# Patient Record
Sex: Male | Born: 1960 | Race: White | Hispanic: No | Marital: Married | Smoking: Current every day smoker
Health system: Southern US, Community
[De-identification: ages and names within clinical notes are randomized; demographics above are authoritative.]

## PROBLEM LIST (undated history)

## (undated) DIAGNOSIS — L97329 Non-pressure chronic ulcer of left ankle with unspecified severity: Secondary | ICD-10-CM

## (undated) DIAGNOSIS — I219 Acute myocardial infarction, unspecified: Secondary | ICD-10-CM

## (undated) DIAGNOSIS — I82409 Acute embolism and thrombosis of unspecified deep veins of unspecified lower extremity: Secondary | ICD-10-CM

## (undated) HISTORY — DX: Non-pressure chronic ulcer of left ankle with unspecified severity: L97.329

## (undated) HISTORY — PX: ROTATOR CUFF REPAIR: SHX139

## (undated) HISTORY — DX: Acute embolism and thrombosis of unspecified deep veins of unspecified lower extremity: I82.409

## (undated) HISTORY — DX: Acute myocardial infarction, unspecified: I21.9

---

## 2001-12-06 ENCOUNTER — Ambulatory Visit (HOSPITAL_COMMUNITY): Admission: RE | Admit: 2001-12-06 | Discharge: 2001-12-06 | Payer: Self-pay | Admitting: Orthopedic Surgery

## 2001-12-06 ENCOUNTER — Encounter: Payer: Self-pay | Admitting: Orthopedic Surgery

## 2004-11-04 ENCOUNTER — Ambulatory Visit: Payer: Self-pay | Admitting: Family Medicine

## 2005-01-13 ENCOUNTER — Ambulatory Visit: Payer: Self-pay | Admitting: Family Medicine

## 2005-04-21 ENCOUNTER — Ambulatory Visit: Payer: Self-pay | Admitting: Family Medicine

## 2005-06-02 ENCOUNTER — Ambulatory Visit: Payer: Self-pay | Admitting: Family Medicine

## 2016-01-29 DIAGNOSIS — Z720 Tobacco use: Secondary | ICD-10-CM | POA: Insufficient documentation

## 2016-01-29 DIAGNOSIS — K219 Gastro-esophageal reflux disease without esophagitis: Secondary | ICD-10-CM

## 2016-01-29 DIAGNOSIS — J449 Chronic obstructive pulmonary disease, unspecified: Secondary | ICD-10-CM

## 2016-01-29 HISTORY — DX: Gastro-esophageal reflux disease without esophagitis: K21.9

## 2016-01-29 HISTORY — DX: Chronic obstructive pulmonary disease, unspecified: J44.9

## 2016-01-29 HISTORY — DX: Tobacco use: Z72.0

## 2016-02-26 DIAGNOSIS — I2510 Atherosclerotic heart disease of native coronary artery without angina pectoris: Secondary | ICD-10-CM | POA: Insufficient documentation

## 2018-06-02 DIAGNOSIS — Z7901 Long term (current) use of anticoagulants: Secondary | ICD-10-CM | POA: Diagnosis not present

## 2018-06-22 DIAGNOSIS — Z6827 Body mass index (BMI) 27.0-27.9, adult: Secondary | ICD-10-CM | POA: Diagnosis not present

## 2018-06-22 DIAGNOSIS — Z7901 Long term (current) use of anticoagulants: Secondary | ICD-10-CM | POA: Diagnosis not present

## 2018-06-22 DIAGNOSIS — F411 Generalized anxiety disorder: Secondary | ICD-10-CM | POA: Diagnosis not present

## 2018-06-22 DIAGNOSIS — Z79899 Other long term (current) drug therapy: Secondary | ICD-10-CM | POA: Diagnosis not present

## 2018-07-20 DIAGNOSIS — Z7901 Long term (current) use of anticoagulants: Secondary | ICD-10-CM | POA: Diagnosis not present

## 2018-07-20 DIAGNOSIS — Z87891 Personal history of nicotine dependence: Secondary | ICD-10-CM | POA: Diagnosis not present

## 2018-07-20 DIAGNOSIS — Z79899 Other long term (current) drug therapy: Secondary | ICD-10-CM | POA: Diagnosis not present

## 2018-07-20 DIAGNOSIS — Z2821 Immunization not carried out because of patient refusal: Secondary | ICD-10-CM | POA: Diagnosis not present

## 2018-07-20 DIAGNOSIS — F411 Generalized anxiety disorder: Secondary | ICD-10-CM | POA: Diagnosis not present

## 2018-07-20 DIAGNOSIS — Z1331 Encounter for screening for depression: Secondary | ICD-10-CM | POA: Diagnosis not present

## 2018-08-02 DIAGNOSIS — Z7901 Long term (current) use of anticoagulants: Secondary | ICD-10-CM | POA: Diagnosis not present

## 2018-08-11 DIAGNOSIS — Z7901 Long term (current) use of anticoagulants: Secondary | ICD-10-CM | POA: Diagnosis not present

## 2018-08-22 DIAGNOSIS — K219 Gastro-esophageal reflux disease without esophagitis: Secondary | ICD-10-CM | POA: Diagnosis not present

## 2018-08-22 DIAGNOSIS — Z7901 Long term (current) use of anticoagulants: Secondary | ICD-10-CM | POA: Diagnosis not present

## 2018-08-22 DIAGNOSIS — R739 Hyperglycemia, unspecified: Secondary | ICD-10-CM | POA: Diagnosis not present

## 2018-08-22 DIAGNOSIS — E785 Hyperlipidemia, unspecified: Secondary | ICD-10-CM | POA: Diagnosis not present

## 2018-08-22 DIAGNOSIS — I2510 Atherosclerotic heart disease of native coronary artery without angina pectoris: Secondary | ICD-10-CM | POA: Diagnosis not present

## 2018-09-05 DIAGNOSIS — Z7901 Long term (current) use of anticoagulants: Secondary | ICD-10-CM | POA: Diagnosis not present

## 2018-09-19 DIAGNOSIS — F411 Generalized anxiety disorder: Secondary | ICD-10-CM | POA: Diagnosis not present

## 2018-09-19 DIAGNOSIS — Z79899 Other long term (current) drug therapy: Secondary | ICD-10-CM | POA: Diagnosis not present

## 2018-09-19 DIAGNOSIS — R6882 Decreased libido: Secondary | ICD-10-CM | POA: Diagnosis not present

## 2018-09-19 DIAGNOSIS — Z7901 Long term (current) use of anticoagulants: Secondary | ICD-10-CM | POA: Diagnosis not present

## 2018-10-21 DIAGNOSIS — Z7901 Long term (current) use of anticoagulants: Secondary | ICD-10-CM | POA: Diagnosis not present

## 2018-10-27 DIAGNOSIS — E785 Hyperlipidemia, unspecified: Secondary | ICD-10-CM | POA: Diagnosis not present

## 2018-10-27 DIAGNOSIS — I2510 Atherosclerotic heart disease of native coronary artery without angina pectoris: Secondary | ICD-10-CM | POA: Diagnosis not present

## 2018-10-27 DIAGNOSIS — Z79899 Other long term (current) drug therapy: Secondary | ICD-10-CM | POA: Diagnosis not present

## 2018-10-27 DIAGNOSIS — F411 Generalized anxiety disorder: Secondary | ICD-10-CM | POA: Diagnosis not present

## 2018-10-30 DIAGNOSIS — I10 Essential (primary) hypertension: Secondary | ICD-10-CM | POA: Diagnosis not present

## 2018-10-30 DIAGNOSIS — R599 Enlarged lymph nodes, unspecified: Secondary | ICD-10-CM | POA: Diagnosis not present

## 2018-10-30 DIAGNOSIS — Z7901 Long term (current) use of anticoagulants: Secondary | ICD-10-CM | POA: Diagnosis not present

## 2018-10-30 DIAGNOSIS — Z6829 Body mass index (BMI) 29.0-29.9, adult: Secondary | ICD-10-CM | POA: Diagnosis not present

## 2018-10-30 DIAGNOSIS — Z86718 Personal history of other venous thrombosis and embolism: Secondary | ICD-10-CM | POA: Diagnosis not present

## 2018-10-30 DIAGNOSIS — R509 Fever, unspecified: Secondary | ICD-10-CM | POA: Diagnosis not present

## 2018-10-30 DIAGNOSIS — I2510 Atherosclerotic heart disease of native coronary artery without angina pectoris: Secondary | ICD-10-CM | POA: Diagnosis not present

## 2018-10-30 DIAGNOSIS — F17210 Nicotine dependence, cigarettes, uncomplicated: Secondary | ICD-10-CM | POA: Diagnosis not present

## 2018-10-30 DIAGNOSIS — K429 Umbilical hernia without obstruction or gangrene: Secondary | ICD-10-CM | POA: Diagnosis not present

## 2018-10-30 DIAGNOSIS — Z7982 Long term (current) use of aspirin: Secondary | ICD-10-CM | POA: Diagnosis not present

## 2018-10-30 DIAGNOSIS — R590 Localized enlarged lymph nodes: Secondary | ICD-10-CM | POA: Diagnosis not present

## 2018-10-30 DIAGNOSIS — R1032 Left lower quadrant pain: Secondary | ICD-10-CM | POA: Diagnosis not present

## 2018-10-30 DIAGNOSIS — I7090 Unspecified atherosclerosis: Secondary | ICD-10-CM | POA: Diagnosis not present

## 2018-10-30 DIAGNOSIS — J449 Chronic obstructive pulmonary disease, unspecified: Secondary | ICD-10-CM | POA: Diagnosis not present

## 2018-10-30 DIAGNOSIS — K5730 Diverticulosis of large intestine without perforation or abscess without bleeding: Secondary | ICD-10-CM | POA: Diagnosis not present

## 2018-10-30 DIAGNOSIS — Z7902 Long term (current) use of antithrombotics/antiplatelets: Secondary | ICD-10-CM | POA: Diagnosis not present

## 2018-11-01 DIAGNOSIS — I2510 Atherosclerotic heart disease of native coronary artery without angina pectoris: Secondary | ICD-10-CM | POA: Diagnosis not present

## 2018-11-01 DIAGNOSIS — Z7901 Long term (current) use of anticoagulants: Secondary | ICD-10-CM | POA: Diagnosis not present

## 2018-11-01 DIAGNOSIS — R590 Localized enlarged lymph nodes: Secondary | ICD-10-CM | POA: Diagnosis not present

## 2018-11-01 DIAGNOSIS — R7989 Other specified abnormal findings of blood chemistry: Secondary | ICD-10-CM | POA: Diagnosis not present

## 2018-11-10 DIAGNOSIS — Z79899 Other long term (current) drug therapy: Secondary | ICD-10-CM | POA: Diagnosis not present

## 2018-11-10 DIAGNOSIS — Z131 Encounter for screening for diabetes mellitus: Secondary | ICD-10-CM | POA: Diagnosis not present

## 2018-11-10 DIAGNOSIS — E785 Hyperlipidemia, unspecified: Secondary | ICD-10-CM | POA: Diagnosis not present

## 2018-11-10 DIAGNOSIS — I2510 Atherosclerotic heart disease of native coronary artery without angina pectoris: Secondary | ICD-10-CM | POA: Diagnosis not present

## 2018-11-10 DIAGNOSIS — Z6829 Body mass index (BMI) 29.0-29.9, adult: Secondary | ICD-10-CM | POA: Diagnosis not present

## 2018-11-10 DIAGNOSIS — Z7901 Long term (current) use of anticoagulants: Secondary | ICD-10-CM | POA: Diagnosis not present

## 2018-11-10 DIAGNOSIS — Z0000 Encounter for general adult medical examination without abnormal findings: Secondary | ICD-10-CM | POA: Diagnosis not present

## 2018-11-17 DIAGNOSIS — Z6829 Body mass index (BMI) 29.0-29.9, adult: Secondary | ICD-10-CM | POA: Diagnosis not present

## 2018-11-17 DIAGNOSIS — I872 Venous insufficiency (chronic) (peripheral): Secondary | ICD-10-CM | POA: Diagnosis not present

## 2018-11-17 DIAGNOSIS — Z79899 Other long term (current) drug therapy: Secondary | ICD-10-CM | POA: Diagnosis not present

## 2018-11-17 DIAGNOSIS — G629 Polyneuropathy, unspecified: Secondary | ICD-10-CM | POA: Diagnosis not present

## 2018-11-23 ENCOUNTER — Ambulatory Visit (HOSPITAL_COMMUNITY)
Admission: RE | Admit: 2018-11-23 | Discharge: 2018-11-23 | Disposition: A | Discharge status: Home / Self Care | Payer: BLUE CROSS/BLUE SHIELD | Source: Ambulatory Visit | Attending: Family | Admitting: Family

## 2018-11-23 ENCOUNTER — Ambulatory Visit: Payer: BLUE CROSS/BLUE SHIELD | Admitting: Vascular Surgery

## 2018-11-23 ENCOUNTER — Encounter: Payer: Self-pay | Admitting: Vascular Surgery

## 2018-11-23 ENCOUNTER — Other Ambulatory Visit: Payer: Self-pay

## 2018-11-23 ENCOUNTER — Other Ambulatory Visit: Payer: Self-pay | Admitting: *Deleted

## 2018-11-23 VITALS — BP 149/94 | HR 79 | Temp 97.1°F | Resp 18 | Ht 72.0 in | Wt 224.2 lb

## 2018-11-23 DIAGNOSIS — I872 Venous insufficiency (chronic) (peripheral): Secondary | ICD-10-CM

## 2018-11-23 DIAGNOSIS — L03116 Cellulitis of left lower limb: Secondary | ICD-10-CM

## 2018-11-23 DIAGNOSIS — I83812 Varicose veins of left lower extremities with pain: Secondary | ICD-10-CM

## 2018-11-23 MED ORDER — CEPHALEXIN 500 MG PO CAPS: 500.0000 mg | ORAL_CAPSULE | Freq: Three times a day (TID) | ORAL | 0 refills | Status: DC

## 2018-11-23 NOTE — Progress Notes (Signed)
Referring Physician: Dr Birdie Hopes  Patient name: John Jimenez MRN: [id number] DOB: [birth year] Sex: male  REASON FOR CONSULT: Multiple recurrent ulcerations left leg  HPI: John Jimenez is a 58 y.o. male who has had multiple exacerbation and remissions of a left ankle ulcer.  Currently this is healed.  He states that he develops ulcers frequently they take several weeks to heal up and then they return after they have healed.  He is currently followed at the wound clinic in [place].  He did have a trauma to the left leg when he was about 58 years old that crushed his left ankle area.  He is was seen by Dr. Jacolyn Reedy at [facility] in 2015.  At that point he was noted to have chronic left popliteal DVT as well as in the femoral vein mid thigh.  He was managed conservatively with compression stockings.  Continues to wear compression currently.  He has 30 to 40 mm knee-high stockings currently.  He currently is on warfarin and has been on this for 18 months.  Of note he developed swelling in his left groin with redness a few weeks ago.  He was given a course of Keflex that lasted 7 days.  The redness in his left groin has now returned.  He denies any fever or chills.  He does smoke about a pack of cigarettes per day.  I discussed with him for greater than 3 minutes today smoking cessation as far as its role in wound healing.  Past Medical History:  Diagnosis Date  . DVT (deep venous thrombosis) (HCC)   . Myocardial infarction (HCC)   . Ulcer of left ankle (HCC)    PSH; external fixation left leg  No family history on file.  SOCIAL HISTORY: Social History   Socioeconomic History  . Marital status: Married    Spouse name: Not on file  . Number of children: Not on file  . Years of education: Not on file  . Highest education level: Not on file  Occupational History  . Not on file  Social Needs  . Financial resource strain: Not on file  . Food insecurity:    Worry: Not on file   Inability: Not on file  . Transportation needs:    Medical: Not on file    Non-medical: Not on file  Tobacco Use  . Smoking status: Current Every Day Smoker    Packs/day: 1.00  . Smokeless tobacco: Never Used  Substance and Sexual Activity  . Alcohol use: Yes    Comment: occassional  . Drug use: Not on file  . Sexual activity: Not on file  Lifestyle  . Physical activity:    Days per week: Not on file    Minutes per session: Not on file  . Stress: Not on file  Relationships  . Social connections:    Talks on phone: Not on file    Gets together: Not on file    Attends religious service: Not on file    Active member of club or organization: Not on file    Attends meetings of clubs or organizations: Not on file    Relationship status: Not on file  . Intimate partner violence:    Fear of current or ex partner: Not on file    Emotionally abused: Not on file    Physically abused: Not on file    Forced sexual activity: Not on file  Other Topics Concern  . Not on file  Social History Narrative  . Not on file    No Known Allergies  Current Outpatient Medications  Medication Sig Dispense Refill  . ALPRAZolam (XANAX) 0.5 MG tablet TAKE 1 TABLET BY MOUTH 3 TIMES A DAY AS NEEDED    . aspirin 81 MG chewable tablet Chew by mouth.    . clopidogrel (PLAVIX) 75 MG tablet Take by mouth.    Marland Kitchen omeprazole (PRILOSEC) 40 MG capsule Take by mouth.    . simvastatin (ZOCOR) 40 MG tablet Take by mouth.    . warfarin (COUMADIN) 7.5 MG tablet Take by mouth. Takes 12.5 mg M/W/F and 10 mg on T/TH/Sat/Sun     No current facility-administered medications for this visit.     ROS:   General:  No weight loss, Fever, chills  HEENT: No recent headaches, no nasal bleeding, no visual changes, no sore throat  Neurologic: No dizziness, blackouts, seizures. No recent symptoms of stroke or mini- stroke. No recent episodes of slurred speech, or temporary blindness.  Cardiac: No recent episodes of chest  pain/pressure, no shortness of breath at rest.  No shortness of breath with exertion.  Denies history of atrial fibrillation or irregular heartbeat  Vascular: No history of rest pain in feet.  No history of claudication.  No history of non-healing ulcer, No history of DVT   Pulmonary: No home oxygen, no productive cough, no hemoptysis,  No asthma or wheezing  Musculoskeletal:  [ ]  Arthritis, [ ]  Low back pain,  [ ]  Joint pain  Hematologic:No history of hypercoagulable state.  No history of easy bleeding.  No history of anemia  Gastrointestinal: No hematochezia or melena,  No gastroesophageal reflux, no trouble swallowing  Urinary: [ ]  chronic Kidney disease, [ ]  on HD - [ ]  MWF or [ ]  TTHS, [ ]  Burning with urination, [ ]  Frequent urination, [ ]  Difficulty urinating;   Skin: No rashes  Psychological: No history of anxiety,  No history of depression   Physical Examination  Vitals:   11/23/18 1447  BP: (!) 149/94  Pulse: 79  Resp: 18  Temp: (!) 97.1 F (36.2 C)  TempSrc: Oral  SpO2: 99%  Weight: 224 lb 3.3 oz (101.7 kg)  Height: 6' (1.829 m)    Body mass index is 30.41 kg/m.  General:  Alert and oriented, no acute distress HEENT: Normal Neck: No JVD Cardiac: Regular Rate and Rhythm Skin: No rash, erythema and induration left anterior thigh extending over a segment is 7 cm x 3 cm with slightly tender to palpation no fluctuance  extremity Pulses:  2+ radial, brachial, femoral, dorsalis pedis, posterior tibial pulses bilaterally Musculoskeletal: No deformity trace left leg edema  Neurologic: Upper and lower extremity motor 5/5 and symmetric  DATA:  Patient had a venous reflux exam today which showed evidence of chronic DVT in the left popliteal vein.  There was also reflux in the greater and lesser saphenous vein.  Greater saphenous vein measured 7 to 10 mm.  There was also a large anterior branch extending over the left thigh.  Lesser saphenous was 5 to 6 mm.  There was  diffuse reflux in both.  ASSESSMENT: Symptomatic varicose veins with diffuse reflux in the superficial system in the left leg.  The patient has a fairly focal obstruction of his deep vein system in the popliteal vein on the left side.  2.  Erythema and induration left inguinal region consistent with inflamed lymph nodes.  I am unsure the etiology of this to but  I do not believe this is related to his veins.   PLAN: The patient was given a prescription today for long leg compression stockings to see if he gets symptomatic relief from this.  Will follow-up with [place] in 3 months time for consideration of laser ablation of his greater and lesser saphenous.  2.  The patient was placed on Keflex a 2-week course and has follow-up with his primary care physician next week.  Since this is his second course of antibiotics for this consideration should be given for biopsy of the lymph node if it does not resolve.  #3 as far as his DVT is concerned.  I suspect this is all chronic in nature.  He has now been on warfarin for 18 months.  This is sufficient treatment for an acute DVT.  I do not believe that he is necessarily at risk for recurrent DVT as he does not really have evidence of hypercoagulable state and has a traumatic event to explain why he had a DVT years ago.  Certainly if he developed a recurrent DVT lifelong anticoagulation might be warranted.  I will leave it at the discretion of his primary care physician whether or not he wishes to stop the warfarin.   Fabienne Bruns, MD [facility] Office: [phone] Pager: [phone]

## 2018-11-29 DIAGNOSIS — F411 Generalized anxiety disorder: Secondary | ICD-10-CM | POA: Diagnosis not present

## 2018-11-29 DIAGNOSIS — Z6829 Body mass index (BMI) 29.0-29.9, adult: Secondary | ICD-10-CM | POA: Diagnosis not present

## 2018-11-29 DIAGNOSIS — R591 Generalized enlarged lymph nodes: Secondary | ICD-10-CM | POA: Diagnosis not present

## 2018-11-29 DIAGNOSIS — Z79899 Other long term (current) drug therapy: Secondary | ICD-10-CM | POA: Diagnosis not present

## 2019-01-03 DIAGNOSIS — K219 Gastro-esophageal reflux disease without esophagitis: Secondary | ICD-10-CM | POA: Diagnosis not present

## 2019-01-03 DIAGNOSIS — F411 Generalized anxiety disorder: Secondary | ICD-10-CM | POA: Diagnosis not present

## 2019-01-03 DIAGNOSIS — I2510 Atherosclerotic heart disease of native coronary artery without angina pectoris: Secondary | ICD-10-CM | POA: Diagnosis not present

## 2019-01-03 DIAGNOSIS — Z7901 Long term (current) use of anticoagulants: Secondary | ICD-10-CM | POA: Diagnosis not present

## 2019-01-03 DIAGNOSIS — E785 Hyperlipidemia, unspecified: Secondary | ICD-10-CM | POA: Diagnosis not present

## 2019-03-01 ENCOUNTER — Ambulatory Visit: Payer: BLUE CROSS/BLUE SHIELD | Admitting: Vascular Surgery

## 2019-03-01 ENCOUNTER — Other Ambulatory Visit: Payer: Self-pay

## 2019-03-01 ENCOUNTER — Encounter: Payer: Self-pay | Admitting: Family

## 2019-03-09 ENCOUNTER — Encounter: Payer: Self-pay | Admitting: Family

## 2019-04-05 ENCOUNTER — Ambulatory Visit: Payer: BC Managed Care – PPO | Admitting: Vascular Surgery

## 2019-04-21 DIAGNOSIS — Z1211 Encounter for screening for malignant neoplasm of colon: Secondary | ICD-10-CM | POA: Diagnosis not present

## 2019-04-21 DIAGNOSIS — K621 Rectal polyp: Secondary | ICD-10-CM | POA: Diagnosis not present

## 2019-05-04 DIAGNOSIS — Z79899 Other long term (current) drug therapy: Secondary | ICD-10-CM | POA: Diagnosis not present

## 2019-05-04 DIAGNOSIS — E785 Hyperlipidemia, unspecified: Secondary | ICD-10-CM | POA: Diagnosis not present

## 2019-05-04 DIAGNOSIS — I2510 Atherosclerotic heart disease of native coronary artery without angina pectoris: Secondary | ICD-10-CM | POA: Diagnosis not present

## 2019-05-04 DIAGNOSIS — F411 Generalized anxiety disorder: Secondary | ICD-10-CM | POA: Diagnosis not present

## 2019-05-04 DIAGNOSIS — K219 Gastro-esophageal reflux disease without esophagitis: Secondary | ICD-10-CM | POA: Diagnosis not present

## 2019-10-11 DIAGNOSIS — Z2821 Immunization not carried out because of patient refusal: Secondary | ICD-10-CM | POA: Diagnosis not present

## 2019-10-11 DIAGNOSIS — F411 Generalized anxiety disorder: Secondary | ICD-10-CM | POA: Diagnosis not present

## 2019-10-11 DIAGNOSIS — Z87891 Personal history of nicotine dependence: Secondary | ICD-10-CM | POA: Diagnosis not present

## 2019-10-11 DIAGNOSIS — Z1331 Encounter for screening for depression: Secondary | ICD-10-CM | POA: Diagnosis not present

## 2019-11-15 DIAGNOSIS — I2510 Atherosclerotic heart disease of native coronary artery without angina pectoris: Secondary | ICD-10-CM | POA: Diagnosis not present

## 2019-11-15 DIAGNOSIS — Z79899 Other long term (current) drug therapy: Secondary | ICD-10-CM | POA: Diagnosis not present

## 2019-11-15 DIAGNOSIS — K219 Gastro-esophageal reflux disease without esophagitis: Secondary | ICD-10-CM | POA: Diagnosis not present

## 2019-11-15 DIAGNOSIS — Z131 Encounter for screening for diabetes mellitus: Secondary | ICD-10-CM | POA: Diagnosis not present

## 2019-11-15 DIAGNOSIS — E785 Hyperlipidemia, unspecified: Secondary | ICD-10-CM | POA: Diagnosis not present

## 2019-11-15 DIAGNOSIS — Z0000 Encounter for general adult medical examination without abnormal findings: Secondary | ICD-10-CM | POA: Diagnosis not present

## 2019-11-15 DIAGNOSIS — I10 Essential (primary) hypertension: Secondary | ICD-10-CM | POA: Diagnosis not present

## 2019-11-15 DIAGNOSIS — Z125 Encounter for screening for malignant neoplasm of prostate: Secondary | ICD-10-CM | POA: Diagnosis not present

## 2019-12-11 ENCOUNTER — Other Ambulatory Visit: Payer: Self-pay | Admitting: *Deleted

## 2019-12-11 DIAGNOSIS — I83812 Varicose veins of left lower extremities with pain: Secondary | ICD-10-CM

## 2019-12-18 DIAGNOSIS — I10 Essential (primary) hypertension: Secondary | ICD-10-CM | POA: Diagnosis not present

## 2019-12-18 DIAGNOSIS — Z79899 Other long term (current) drug therapy: Secondary | ICD-10-CM | POA: Diagnosis not present

## 2019-12-26 ENCOUNTER — Other Ambulatory Visit: Payer: Self-pay | Admitting: *Deleted

## 2019-12-26 DIAGNOSIS — I83812 Varicose veins of left lower extremities with pain: Secondary | ICD-10-CM

## 2019-12-26 DIAGNOSIS — I872 Venous insufficiency (chronic) (peripheral): Secondary | ICD-10-CM

## 2019-12-27 ENCOUNTER — Ambulatory Visit (HOSPITAL_COMMUNITY): Payer: BC Managed Care – PPO | Attending: Vascular Surgery

## 2019-12-27 ENCOUNTER — Ambulatory Visit: Payer: BC Managed Care – PPO | Admitting: Vascular Surgery

## 2020-01-26 NOTE — Progress Notes (Signed)
Cardiology Office Note:    Date:  01/29/2020   ID:  KELSEY EDMAN, DOB 17-Apr-1961, MRN [id number]  PCP:  Helen Hashimoto., MD  Cardiologist:  Shirlee More, MD   Referring MD: Helen Hashimoto., MD  ASSESSMENT:    1. Coronary artery disease of native artery of native heart with stable angina pectoris ([place])   2. Essential hypertension   3. Mixed hyperlipidemia    PLAN:    In order of problems listed above:  1. Known CAD previous apical myocardial infarction with nonobstructive CAD plan cardiac CTA continue medical therapy reassess in the office 6 weeks. 2. Stable continue current treatment beta-blocker ACE inhibitor 3. Lipids are ideal continue statin  Next appointment   Medication Adjustments/Labs and Tests Ordered: Current medicines are reviewed at length with the patient today.  Concerns regarding medicines are outlined above.  Orders Placed This Encounter  Procedures  . CT CORONARY MORPH W/CTA COR W/SCORE W/CA W/CM &/OR WO/CM  . CT CORONARY FRACTIONAL FLOW RESERVE DATA PREP  . CT CORONARY FRACTIONAL FLOW RESERVE FLUID ANALYSIS  . Basic Metabolic Panel (BMET)  . EKG 12-Lead   Meds ordered this encounter  Medications  . metoprolol tartrate (LOPRESSOR) 100 MG tablet    Sig: Take 1 tablet (100 mg total) by mouth once for 1 dose. Take two hours prior to your CT    Dispense:  1 tablet    Refill:  0     Chief Complaint  Patient presents with  . Follow-up  . Coronary Artery Disease    History of Present Illness:    John Jimenez is a 59 y.o. male who is being seen today for the evaluation of mild CAD hypertension dyslipidemia and COPD at the request of Helen Hashimoto., MD.  He had seen him more than 3 years ago 11/30/2016 at [facility] cardiology. He had left heart catheterization performed 05/03/2017with  the apex left ventricle was hypokinetic with 40% stenosis distal LAD 20% stenosis in the midsegment and no other areas of stenosis noted.  Ejection  fraction was estimated at 60%.  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  Recent labs from primary care physician 01/26/2020: Cholesterol 165 triglycerides 195 HDL 48 LDL 84 CBC normal hemoglobin 16.1 CMP normal except glucose 106 creatinine 0.78 potassium 4.5 normal liver function test.  We recognize each other.  Is been more than 3 years since last seen by me.  He is concerned about his cardiovascular prognosis and he now has exercise intolerance and fatigue but no discrete chest pain or shortness of breath and inquires about a repeat ischemia evaluation.  He no longer is anticoagulant he takes dual antiplatelet therapy and is pending intervention by vein specialist agrees with for further evaluation of CAD we discussed modalities cardiac CTA is appropriate and will be scheduled as outpatient.  Continue his current medical treatment including beta-blocker statin and dual antiplatelet therapy Past Medical History:  Diagnosis Date  . DVT (deep venous thrombosis) ([place])   . Myocardial infarction ([place])   . Ulcer of left ankle [facility])     Past Surgical History:  Procedure Laterality Date  . ROTATOR CUFF REPAIR      Current Medications: Current Meds  Medication Sig  . ALPRAZolam (XANAX) 0.25 MG tablet Take 0.5 mg by mouth 2 (two) times daily as needed.  . ASPIRIN LOW DOSE 81 MG EC tablet Take 81 mg by mouth daily.  Marland Kitchen  clopidogrel (PLAVIX) 75 MG tablet Take by mouth.  Marland Kitchen lisinopril (ZESTRIL) 20 MG tablet Take 20 mg by mouth daily.  Marland Kitchen omeprazole (PRILOSEC) 40 MG capsule Take by mouth.  . simvastatin (ZOCOR) 40 MG tablet Take by mouth.     Allergies:   Patient has no known allergies.   Social History   Socioeconomic History  . Marital status: Married    Spouse name: Not on file  . Number of children: Not on file  . Years of education: Not on file  . Highest education level: Not on file  Occupational History  . Not on  file  Tobacco Use  . Smoking status: Current Every Day Smoker    Packs/day: 1.00  . Smokeless tobacco: Never Used  Substance and Sexual Activity  . Alcohol use: Yes    Comment: occassional  . Drug use: Not on file  . Sexual activity: Not on file  Other Topics Concern  . Not on file  Social History Narrative  . Not on file   Social Determinants of Health   Financial Resource Strain:   . Difficulty of Paying Living Expenses:   Food Insecurity:   . Worried About Programme researcher, broadcasting/film/video in the Last Year:   . Barista in the Last Year:   Transportation Needs:   . Freight forwarder (Medical):   Marland Kitchen Lack of Transportation (Non-Medical):   Physical Activity:   . Days of Exercise per Week:   . Minutes of Exercise per Session:   Stress:   . Feeling of Stress :   Social Connections:   . Frequency of Communication with Friends and Family:   . Frequency of Social Gatherings with Friends and Family:   . Attends Religious Services:   . Active Member of Clubs or Organizations:   . Attends Banker Meetings:   Marland Kitchen Marital Status:      Family History: The patient's family history includes Breast cancer in his mother; Diabetes in his father; Heart attack in his father and paternal grandfather; Heart disease in his father; Hyperlipidemia in his father and mother; Hypertension in his father, maternal grandmother, and mother.  ROS:   ROS Please see the history of present illness.     All other systems reviewed and are negative.  EKGs/Labs/Other Studies Reviewed:    The following studies were reviewed today:   EKG:  EKG is  ordered today.  The ekg ordered today is personally reviewed and demonstrates sinus rhythm and is normal  Recent Labs: 11/15/2019 cholesterol 165 HDL 48 LDL 84 creatinine 0.78 A1c normal 5.7  Physical Exam:    VS:  BP (!) 142/102   Pulse 78   Temp 98.3 F (36.8 C)   Ht 6\' 1"  (1.854 m)   Wt 220 lb (99.8 kg)   SpO2 95%   BMI 29.03 kg/m      Wt Readings from Last 3 Encounters:  01/29/20 220 lb (99.8 kg)  11/23/18 224 lb 3.3 oz (101.7 kg)     GEN:  Well nourished, well developed in no acute distress HEENT: Normal NECK: No JVD; No carotid bruits LYMPHATICS: No lymphadenopathy CARDIAC: RRR, no murmurs, rubs, gallops RESPIRATORY:  Clear to auscultation without rales, wheezing or rhonchi  ABDOMEN: Soft, non-tender, non-distended MUSCULOSKELETAL:  No edema; No deformity  SKIN: Warm and dry NEUROLOGIC:  Alert and oriented x 3 PSYCHIATRIC:  Normal affect     Signed, 11/25/18, MD  01/29/2020 2:37 PM  [facility]

## 2020-01-29 ENCOUNTER — Encounter: Payer: Self-pay | Admitting: Cardiology

## 2020-01-29 ENCOUNTER — Ambulatory Visit: Payer: BC Managed Care – PPO | Admitting: Cardiology

## 2020-01-29 ENCOUNTER — Other Ambulatory Visit: Payer: Self-pay

## 2020-01-29 VITALS — BP 142/102 | HR 78 | Temp 98.3°F | Ht 73.0 in | Wt 220.0 lb

## 2020-01-29 DIAGNOSIS — E782 Mixed hyperlipidemia: Secondary | ICD-10-CM | POA: Diagnosis not present

## 2020-01-29 DIAGNOSIS — I25118 Atherosclerotic heart disease of native coronary artery with other forms of angina pectoris: Secondary | ICD-10-CM

## 2020-01-29 DIAGNOSIS — I10 Essential (primary) hypertension: Secondary | ICD-10-CM

## 2020-01-29 MED ORDER — METOPROLOL TARTRATE 100 MG PO TABS: 100.0000 mg | ORAL_TABLET | Freq: Once | ORAL | 0 refills | Status: DC

## 2020-01-29 NOTE — Patient Instructions (Signed)
Medication Instructions:  Your physician recommends that you continue on your current medications as directed. Please refer to the Current Medication list given to you today.  *If you need a refill on your cardiac medications before your next appointment, please call your pharmacy*   Lab Work: Your physician recommends that you return for lab work in: TODAY  BMP If you have labs (blood work) drawn today and your tests are completely normal, you will receive your results only by: . MyChart Message (if you have MyChart) OR . A paper copy in the mail If you have any lab test that is abnormal or we need to change your treatment, we will call you to review the results.   Testing/Procedures: Your cardiac CT will be scheduled at the below location:   [facility] [address] , [place] [address] [phone]   If scheduled at [facility], please arrive at the North Tower main entrance of [facility] 30 minutes prior to test start time. Proceed to the  Radiology Department (first floor) to check-in and test prep.   Please follow these instructions carefully (unless otherwise directed):  Hold all erectile dysfunction medications at least 3 days (72 hrs) prior to test.  On the Night Before the Test: . Be sure to Drink plenty of water. . Do not consume any caffeinated/decaffeinated beverages or chocolate 12 hours prior to your test. . Do not take any antihistamines 12 hours prior to your test.  On the Day of the Test: . Drink plenty of water. Do not drink any water within one hour of the test. . Do not eat any food 4 hours prior to the test. . You may take your regular medications prior to the test.  . Take metoprolol (Lopressor) two hours prior to test.       After the Test: . Drink plenty of water. . After receiving IV contrast, you may experience a mild flushed feeling. This is normal. . On occasion, you may experience a mild rash  up to 24 hours after the test. This is not dangerous. If this occurs, you can take Benadryl 25 mg and increase your fluid intake. . If you experience trouble breathing, this can be serious. If it is severe call 911 IMMEDIATELY. If it is mild, please call our office. . If you take any of these medications: Glipizide/Metformin, Avandament, Glucavance, please do not take 48 hours after completing test unless otherwise instructed.   Once we have confirmed authorization from your insurance company, we will call you to set up a date and time for your test.   For non-scheduling related questions, please contact the cardiac imaging nurse navigator should you have any questions/concerns: Sara Wallace, RN Navigator Cardiac Imaging  [facility] [phone] office  For scheduling needs, including cancellations and rescheduling, please call [phone].      Follow-Up: At [facility], you and your health needs are our priority.  As part of our continuing mission to provide you with exceptional heart care, we have created designated Provider Care Teams.  These Care Teams include your primary Cardiologist (physician) and Advanced Practice Providers (APPs -  Physician Assistants and Nurse Practitioners) who all work together to provide you with the care you need, when you need it.  We recommend signing up for the patient portal called "MyChart".  Sign up information is provided on this After Visit Summary.  MyChart is used to connect with patients for Virtual Visits (  Telemedicine).  Patients are able to view lab/test results, encounter notes, upcoming appointments, etc.  Non-urgent messages can be sent to your provider as well.   To learn more about what you can do with MyChart, go to [email or web address].    Your next appointment:   6 week(s)  The format for your next appointment:   In Person  Provider:   Brian Munley, MD   Other Instructions   

## 2020-01-30 ENCOUNTER — Telehealth: Payer: Self-pay

## 2020-01-30 LAB — BASIC METABOLIC PANEL
BUN/Creatinine Ratio: 11 (ref 9–20)
BUN: 8 mg/dL (ref 6–24)
CO2: 21 mmol/L (ref 20–29)
Calcium: 9.5 mg/dL (ref 8.7–10.2)
Chloride: 101 mmol/L (ref 96–106)
Creatinine, Ser: 0.76 mg/dL (ref 0.76–1.27)
GFR calc Af Amer: 116 mL/min/{1.73_m2} (ref >59)
GFR calc non Af Amer: 101 mL/min/{1.73_m2} (ref >59)
Glucose: 124 mg/dL — ABNORMAL HIGH (ref 65–99)
Potassium: 3.6 mmol/L (ref 3.5–5.2)
Sodium: 139 mmol/L (ref 134–144)

## 2020-01-30 NOTE — Telephone Encounter (Signed)
-----   Message from Brian J Munley, MD sent at 01/30/2020  8:28 AM EDT ----- Normal or stable result  No changes 

## 2020-01-30 NOTE — Telephone Encounter (Signed)
Tried calling patient. No answer and no voicemail set up for me to leave a message. 

## 2020-01-31 ENCOUNTER — Encounter: Payer: Self-pay | Admitting: *Deleted

## 2020-01-31 NOTE — Telephone Encounter (Signed)
Tried calling patient. No answer and no voicemail set up for me to leave a message. 

## 2020-02-05 ENCOUNTER — Other Ambulatory Visit: Payer: Self-pay | Admitting: *Deleted

## 2020-02-05 DIAGNOSIS — I83812 Varicose veins of left lower extremities with pain: Secondary | ICD-10-CM

## 2020-02-06 ENCOUNTER — Telehealth (HOSPITAL_COMMUNITY): Payer: Self-pay

## 2020-02-06 NOTE — Telephone Encounter (Signed)

## 2020-02-07 ENCOUNTER — Ambulatory Visit (HOSPITAL_COMMUNITY)
Admission: RE | Admit: 2020-02-07 | Discharge: 2020-02-07 | Disposition: A | Discharge status: Home / Self Care | Payer: BC Managed Care – PPO | Source: Ambulatory Visit | Attending: Vascular Surgery | Admitting: Vascular Surgery

## 2020-02-07 ENCOUNTER — Encounter: Payer: Self-pay | Admitting: Vascular Surgery

## 2020-02-07 ENCOUNTER — Ambulatory Visit (INDEPENDENT_AMBULATORY_CARE_PROVIDER_SITE_OTHER): Payer: BC Managed Care – PPO | Admitting: Vascular Surgery

## 2020-02-07 ENCOUNTER — Other Ambulatory Visit: Payer: Self-pay

## 2020-02-07 VITALS — BP 150/101 | HR 67 | Temp 97.9°F | Resp 16 | Ht 73.0 in | Wt 228.0 lb

## 2020-02-07 DIAGNOSIS — I83812 Varicose veins of left lower extremities with pain: Secondary | ICD-10-CM

## 2020-02-07 NOTE — Progress Notes (Signed)
Patient is a 59 year old male who returns for follow-up today.  He has had multiple exacerbation remissions of left ankle ulcer in the left calf ulcer.  He is currently still trying to get a left calf ulcer completely healed.  He had trauma to his left leg when he was 59 years old with an ankle crush fracture.  He has worn compression stockings since 2015.  He currently wears 30 to 40 mm of compression.  This controls the swelling but he still has episodes where he has skin breakdown.  He is now off anticoagulation.  Past Medical History:  Diagnosis Date  . DVT (deep venous thrombosis) (HCC)   . Myocardial infarction (HCC)   . Ulcer of left ankle [facility])     Past Surgical History:  Procedure Laterality Date  . ROTATOR CUFF REPAIR      Current Outpatient Medications on File Prior to Visit  Medication Sig Dispense Refill  . ALPRAZolam (XANAX) 0.25 MG tablet Take 0.5 mg by mouth 2 (two) times daily as needed.    . ASPIRIN LOW DOSE 81 MG EC tablet Take 81 mg by mouth daily.    . clopidogrel (PLAVIX) 75 MG tablet Take by mouth.    Marland Kitchen lisinopril (ZESTRIL) 20 MG tablet Take 20 mg by mouth daily.    Marland Kitchen omeprazole (PRILOSEC) 40 MG capsule Take by mouth.    . simvastatin (ZOCOR) 40 MG tablet Take by mouth.    . metoprolol tartrate (LOPRESSOR) 100 MG tablet Take 1 tablet (100 mg total) by mouth once for 1 dose. Take two hours prior to your CT 1 tablet 0   No current facility-administered medications on file prior to visit.      Review of systems: He has no shortness of breath.  He has no chest pain.  Physical exam:  Vitals:   02/07/20 1454  BP: (!) 150/101  Pulse: 67  Resp: 16  Temp: 97.9 F (36.6 C)  TempSrc: Temporal  SpO2: 99%  Weight: 228 lb (103.4 kg)  Height: 6\' 1"  (1.854 m)    Extremities: 2+ dorsalis pedis pulses  Skin: Hemosiderin staining left medial thigh and calf no open ulceration currently varicosities visible on the skin surface especially in the left medial thigh see  images below      Data: Patient had a venous duplex ultrasound today which showed reflux in the greater saphenous vein as well as within the deep system.  He did have chronic DVT in the popliteal vein but this was not occlusive.  Although vein diameter was noted to be 3 to 4 mm in the mid segment of the greater saphenous vein.  This is most likely because the deeper branch was measured but a more superficial branch measures 6 to 7 mm in diameter after it comes out of the fascia on my exam at the bedside with the SonoSite.  He also had dilation of the lesser saphenous vein to 5 mm with diffuse reflux.  On my exam with the SonoSite I was unable to completely tracked this into the popliteal vein and it appears to track to the superficial femoral vein higher in the thigh.  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  Patient also has diffuse superficial venous reflux with skin breakdown.  Plan: Laser ablation left greater saphenous vein with greater than 20 stab avulsions to decrease skin breakdown in his left leg.  His lesser saphenous vein is also dilated but this may be an important collateral  for his popliteal vein.  The popliteal vein was not completely occluded but I would not consider disrupting any of the lesser saphenous system unless he has continued symptoms after laser ablation of the left greater saphenous with stab avulsions.  We will hopefully get the patient scheduled in the near future pending insurance approval.  Ruta Hinds, MD [facility] Office: [phone]

## 2020-03-07 ENCOUNTER — Other Ambulatory Visit: Payer: Self-pay | Admitting: Cardiology

## 2020-03-11 NOTE — Progress Notes (Deleted)
Cardiology Office Note:    Date:  03/11/2020   ID:  Monia Sabal, DOB December 05, 1960, MRN [id number]  PCP:  Helen Hashimoto., MD  Cardiologist:  Shirlee More, MD    Referring MD: Helen Hashimoto., MD    ASSESSMENT:    No diagnosis found. PLAN:    In order of problems listed above:  1. ***   Next appointment: ***   Medication Adjustments/Labs and Tests Ordered: Current medicines are reviewed at length with the patient today.  Concerns regarding medicines are outlined above.  No orders of the defined types were placed in this encounter.  No orders of the defined types were placed in this encounter.   No chief complaint on file.   History of Present Illness:    John Jimenez is a 59 y.o. male with a hx of mild CAD hypertension dyslipidemia and COPD last seen 01/29/2020. 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 Patient also has diffuse superficial venous reflux with skin breakdown and follows with Dr. Oneida Alar vascular surgery [place]  Compliance with diet, lifestyle and medications: *** Past Medical History:  Diagnosis Date  . DVT (deep venous thrombosis) ([place])   . Myocardial infarction ([place])   . Ulcer of left ankle [facility])     Past Surgical History:  Procedure Laterality Date  . ROTATOR CUFF REPAIR      Current Medications: No outpatient medications have been marked as taking for the 03/12/20 encounter (Appointment) with Richardo Priest, MD.     Allergies:   Patient has no known allergies.   Social History   Socioeconomic History  . Marital status: Married    Spouse name: Not on file  . Number of children: Not on file  . Years of education: Not on file  . Highest education level: Not on file  Occupational History  . Not on file  Tobacco Use  . Smoking status: Current Every Day Smoker    Packs/day: 1.00  . Smokeless tobacco: Never Used  Substance and Sexual Activity  . Alcohol use: Yes    Comment:  occassional  . Drug use: Not on file  . Sexual activity: Not on file  Other Topics Concern  . Not on file  Social History Narrative  . Not on file   Social Determinants of Health   Financial Resource Strain:   . Difficulty of Paying Living Expenses:   Food Insecurity:   . Worried About Charity fundraiser in the Last Year:   . Arboriculturist in the Last Year:   Transportation Needs:   . Film/video editor (Medical):   Marland Kitchen Lack of Transportation (Non-Medical):   Physical Activity:   . Days of Exercise per Week:   . Minutes of Exercise per Session:   Stress:   . Feeling of Stress :   Social Connections:   . Frequency of Communication with Friends and Family:   . Frequency of Social Gatherings with Friends and Family:   . Attends Religious Services:   . Active Member of Clubs or Organizations:   . Attends Archivist Meetings:   Marland Kitchen Marital Status:      Family History: The patient's ***family history includes Breast cancer in his mother; Diabetes in his father; Heart attack in his father and paternal grandfather; Heart disease in his father; Hyperlipidemia in his father and mother; Hypertension in his father, maternal grandmother, and mother. ROS:   Please see the history  of present illness.    All other systems reviewed and are negative.  EKGs/Labs/Other Studies Reviewed:    The following studies were reviewed today:  EKG:  EKG ordered today and personally reviewed.  The ekg ordered today demonstrates ***  Recent Labs: 01/29/2020: BUN 8; Creatinine, Ser 0.76; Potassium 3.6; Sodium 139  Recent Lipid Panel No results found for: CHOL, TRIG, HDL, CHOLHDL, VLDL, LDLCALC, LDLDIRECT  Physical Exam:    VS:  There were no vitals taken for this visit.    Wt Readings from Last 3 Encounters:  02/07/20 228 lb (103.4 kg)  01/29/20 220 lb (99.8 kg)  11/23/18 224 lb 3.3 oz (101.7 kg)     GEN: *** Well nourished, well developed in no acute distress HEENT:  Normal NECK: No JVD; No carotid bruits LYMPHATICS: No lymphadenopathy CARDIAC: ***RRR, no murmurs, rubs, gallops RESPIRATORY:  Clear to auscultation without rales, wheezing or rhonchi  ABDOMEN: Soft, non-tender, non-distended MUSCULOSKELETAL:  No edema; No deformity  SKIN: Warm and dry NEUROLOGIC:  Alert and oriented x 3 PSYCHIATRIC:  Normal affect    Signed, Norman Herrlich, MD  03/11/2020 7:52 PM    [facility]

## 2020-03-12 ENCOUNTER — Telehealth: Payer: Self-pay

## 2020-03-12 ENCOUNTER — Ambulatory Visit: Payer: BC Managed Care – PPO | Admitting: Cardiology

## 2020-03-12 NOTE — Telephone Encounter (Signed)
error 

## 2020-03-19 ENCOUNTER — Other Ambulatory Visit: Payer: Self-pay | Admitting: *Deleted

## 2020-03-19 DIAGNOSIS — I83812 Varicose veins of left lower extremities with pain: Secondary | ICD-10-CM

## 2020-04-05 ENCOUNTER — Telehealth (HOSPITAL_COMMUNITY): Payer: Self-pay | Admitting: *Deleted

## 2020-04-05 NOTE — Telephone Encounter (Signed)
Attempted to call patient regarding upcoming cardiac CT appointment. Left message on message with name and callback number  Kerin Ransom Tai RN Navigator [facility] [phone] Office [phone] Cell

## 2020-04-08 ENCOUNTER — Encounter (HOSPITAL_COMMUNITY): Payer: Self-pay

## 2020-04-08 ENCOUNTER — Ambulatory Visit (HOSPITAL_COMMUNITY)
Admission: RE | Admit: 2020-04-08 | Discharge: 2020-04-08 | Disposition: A | Discharge status: Home / Self Care | Payer: BC Managed Care – PPO | Source: Ambulatory Visit | Attending: Cardiology | Admitting: Cardiology

## 2020-04-08 ENCOUNTER — Other Ambulatory Visit: Payer: Self-pay

## 2020-04-08 DIAGNOSIS — I25118 Atherosclerotic heart disease of native coronary artery with other forms of angina pectoris: Secondary | ICD-10-CM | POA: Insufficient documentation

## 2020-04-08 MED ORDER — NITROGLYCERIN 0.4 MG SL SUBL
SUBLINGUAL_TABLET | SUBLINGUAL | Status: AC
  Filled 2020-04-08: qty 2

## 2020-04-08 MED ORDER — NITROGLYCERIN 0.4 MG SL SUBL
0.8000 mg | SUBLINGUAL_TABLET | Freq: Once | SUBLINGUAL | Status: AC
  Administered 2020-04-08: 0.8 mg via SUBLINGUAL

## 2020-04-08 MED ORDER — IOHEXOL 350 MG/ML SOLN
80.0000 mL | Freq: Once | INTRAVENOUS | Status: AC | PRN
  Administered 2020-04-08: 80 mL via INTRAVENOUS

## 2020-04-11 ENCOUNTER — Ambulatory Visit (HOSPITAL_COMMUNITY)
Admission: RE | Admit: 2020-04-11 | Discharge: 2020-04-11 | Disposition: A | Discharge status: Home / Self Care | Payer: BC Managed Care – PPO | Source: Ambulatory Visit | Attending: Cardiology | Admitting: Cardiology

## 2020-04-11 DIAGNOSIS — I25118 Atherosclerotic heart disease of native coronary artery with other forms of angina pectoris: Secondary | ICD-10-CM | POA: Diagnosis not present

## 2020-04-12 ENCOUNTER — Telehealth: Payer: Self-pay

## 2020-04-12 NOTE — Telephone Encounter (Signed)
Spoke with patient regarding results and recommendation.  Patient verbalizes understanding and is agreeable to plan of care. Advised patient to call back with any issues or concerns.  

## 2020-04-12 NOTE — Telephone Encounter (Signed)
-----   Message from Baldo Daub, MD sent at 04/12/2020  4:11 PM EDT ----- The CTA is abnormal would like to see him in the office next week just not on afternoon when I am the only physician.

## 2020-04-13 NOTE — Progress Notes (Deleted)
Cardiology Office Note:    Date:  04/13/2020   ID:  John Jimenez, DOB [birth year], MRN [id number]  PCP:  Wilmer Floor., MD  Cardiologist:  Norman Herrlich, MD    Referring MD: Wilmer Floor., MD    ASSESSMENT:    No diagnosis found. PLAN:    In order of problems listed above:  1. ***   Next appointment: ***   Medication Adjustments/Labs and Tests Ordered: Current medicines are reviewed at length with the patient today.  Concerns regarding medicines are outlined above.  No orders of the defined types were placed in this encounter.  No orders of the defined types were placed in this encounter.   No chief complaint on file.   History of Present Illness:    John Jimenez is a 59 y.o. male with a hx of mild CAD hypertension dyslipidemia and COPD at the request of Wilmer Floor., MD.  He had seen him more than 3 years ago 11/30/2016 at [facility]. He had left heart catheterization performed 05/03/2017with  the apex left ventricle was hypokinetic with 40% stenosis distal LAD 20% stenosis in the midsegment and no other areas of stenosis noted.  Ejection fraction was estimated at 60%.  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 He was last seen 01/29/2020 and referred for cardiac CTA. Compliance with diet, lifestyle and medications: ***  Cardiac CTA showed a high calcium score 308 88th percentile for age and sex matched control and moderate CAD involving the mid left anterior descending coronary artery enlargement of the ascending aorta.  The LAD stenosis was 50 to 60% high risk markers of low attenuation core and napkin ring calcification.  Other stenoses were less than 50%.  Sending aorta measured 44 millimeters. Past Medical History:  Diagnosis Date  . DVT (deep venous thrombosis) (HCC)   . Myocardial infarction (HCC)   . Ulcer of left ankle [facility])     Past Surgical History:    Procedure Laterality Date  . ROTATOR CUFF REPAIR      Current Medications: No outpatient medications have been marked as taking for the 04/15/20 encounter (Appointment) with Baldo Daub, MD.     Allergies:   Patient has no known allergies.   Social History   Socioeconomic History  . Marital status: Married    Spouse name: Not on file  . Number of children: Not on file  . Years of education: Not on file  . Highest education level: Not on file  Occupational History  . Not on file  Tobacco Use  . Smoking status: Current Every Day Smoker    Packs/day: 1.00  . Smokeless tobacco: Never Used  Substance and Sexual Activity  . Alcohol use: Yes    Comment: occassional  . Drug use: Not on file  . Sexual activity: Not on file  Other Topics Concern  . Not on file  Social History Narrative  . Not on file   Social Determinants of Health   Financial Resource Strain:   . Difficulty of Paying Living Expenses:   Food Insecurity:   . Worried About Programme researcher, broadcasting/film/video in the Last Year:   . Barista in the Last Year:   Transportation Needs:   . Freight forwarder (Medical):   Marland Kitchen Lack of Transportation (Non-Medical):   Physical Activity:   . Days of Exercise per Week:   . Minutes of  Exercise per Session:   Stress:   . Feeling of Stress :   Social Connections:   . Frequency of Communication with Friends and Family:   . Frequency of Social Gatherings with Friends and Family:   . Attends Religious Services:   . Active Member of Clubs or Organizations:   . Attends Banker Meetings:   Marland Kitchen Marital Status:      Family History: The patient's ***family history includes Breast cancer in his mother; Diabetes in his father; Heart attack in his father and paternal grandfather; Heart disease in his father; Hyperlipidemia in his father and mother; Hypertension in his father, maternal grandmother, and mother. ROS:   Please see the history of present illness.    All  other systems reviewed and are negative.  EKGs/Labs/Other Studies Reviewed:    The following studies were reviewed today:  EKG:  EKG ordered today and personally reviewed.  The ekg ordered today demonstrates ***  Recent Labs: 01/29/2020: BUN 8; Creatinine, Ser 0.76; Potassium 3.6; Sodium 139  Recent Lipid Panel No results found for: CHOL, TRIG, HDL, CHOLHDL, VLDL, LDLCALC, LDLDIRECT  Physical Exam:    VS:  There were no vitals taken for this visit.    Wt Readings from Last 3 Encounters:  02/07/20 228 lb (103.4 kg)  01/29/20 220 lb (99.8 kg)  11/23/18 224 lb 3.3 oz (101.7 kg)     GEN: *** Well nourished, well developed in no acute distress HEENT: Normal NECK: No JVD; No carotid bruits LYMPHATICS: No lymphadenopathy CARDIAC: ***RRR, no murmurs, rubs, gallops RESPIRATORY:  Clear to auscultation without rales, wheezing or rhonchi  ABDOMEN: Soft, non-tender, non-distended MUSCULOSKELETAL:  No edema; No deformity  SKIN: Warm and dry NEUROLOGIC:  Alert and oriented x 3 PSYCHIATRIC:  Normal affect    Signed, Norman Herrlich, MD  04/13/2020 3:50 PM    [facility]

## 2020-04-14 ENCOUNTER — Ambulatory Visit (HOSPITAL_COMMUNITY)
Admission: RE | Admit: 2020-04-14 | Discharge: 2020-04-14 | Disposition: A | Discharge status: Home / Self Care | Payer: BC Managed Care – PPO | Source: Ambulatory Visit | Attending: Cardiology | Admitting: Cardiology

## 2020-04-14 ENCOUNTER — Other Ambulatory Visit: Payer: Self-pay | Admitting: Cardiology

## 2020-04-14 DIAGNOSIS — R079 Chest pain, unspecified: Secondary | ICD-10-CM

## 2020-04-15 ENCOUNTER — Ambulatory Visit: Payer: BC Managed Care – PPO | Admitting: Cardiology

## 2020-04-16 ENCOUNTER — Telehealth: Payer: Self-pay

## 2020-04-16 DIAGNOSIS — I25118 Atherosclerotic heart disease of native coronary artery with other forms of angina pectoris: Secondary | ICD-10-CM | POA: Diagnosis not present

## 2020-04-16 NOTE — Telephone Encounter (Signed)
-----   Message from Baldo Daub, MD sent at 04/16/2020 12:26 PM EDT ----- stable result  Please be sure he has an appointment for follow-up

## 2020-04-16 NOTE — Telephone Encounter (Signed)
Left message on patients voicemail to please return our call.   

## 2020-04-17 NOTE — Telephone Encounter (Signed)
Spoke with patient and patients wife and got him scheduled on 05/02/20. This was the best appointment that they felt like he could make because he is having a "surgical procedure" in several days. They verbalizes understanding of this appointment and did not have any other questions or concerns at this time.    Encouraged patient to call back with any questions or concerns.

## 2020-04-24 ENCOUNTER — Encounter: Payer: Self-pay | Admitting: Vascular Surgery

## 2020-04-24 ENCOUNTER — Other Ambulatory Visit: Payer: Self-pay

## 2020-04-24 ENCOUNTER — Ambulatory Visit (INDEPENDENT_AMBULATORY_CARE_PROVIDER_SITE_OTHER): Payer: BC Managed Care – PPO | Admitting: Vascular Surgery

## 2020-04-24 VITALS — BP 119/90 | HR 87 | Temp 97.0°F | Resp 18 | Ht 73.0 in | Wt 216.0 lb

## 2020-04-24 DIAGNOSIS — I83812 Varicose veins of left lower extremities with pain: Secondary | ICD-10-CM

## 2020-04-24 HISTORY — PX: ENDOVENOUS ABLATION SAPHENOUS VEIN W/ LASER: SUR449

## 2020-04-24 NOTE — Progress Notes (Signed)
° ° °   Laser Ablation Procedure    Date: 04/24/2020   John Jimenez DOB:28-Nov-1960  Consent signed: Yes    Surgeon: Fabienne Bruns MD  Procedure: Laser Ablation: left Greater Saphenous Vein  BP (!) 119/90 (BP Location: Left Arm, Patient Position: Sitting, Cuff Size: Normal)    Pulse 87    Temp (!) 97 F (36.1 C) (Temporal)    Resp 18    Ht 6\' 1"  (1.854 m)    Wt (!) 216 lb (98 kg)    SpO2 98%    BMI 28.50 kg/m   Tumescent Anesthesia: 600 cc 0.9% NaCl with 50 cc Lidocaine HCL 1%  and 15 cc 8.4% NaHCO3  Local Anesthesia: 12 cc Lidocaine HCL and NaHCO3 (ratio 2:1)  7 watts continuous mode     Total energy: 722 Joules    Total time: 103 seconds  Treatment Length  17 cm   Laser Fiber Ref. #     Lot # [id number]   Stab Phlebectomy: >20 Sites: Thigh and Calf  Patient tolerated procedure well  Notes: Patient wore face mask.  All staff members wore facial masks and facial shields/goggles.    Description of Procedure:  After marking the course of the secondary varicosities, the patient was placed on the operating table in the supine position, and the left leg was prepped and draped in sterile fashion.   Local anesthetic was administered and under ultrasound guidance the saphenous vein was accessed with a micro needle and guide wire; then the mirco puncture sheath was placed.  A guide wire was inserted saphenofemoral junction , followed by a 5 french sheath.  The position of the sheath and then the laser fiber below the junction was confirmed using the ultrasound.  Tumescent anesthesia was administered along the course of the saphenous vein using ultrasound guidance. The patient was placed in Trendelenburg position and protective laser glasses were placed on patient and staff, and the laser was fired at 7 watts continuous mode  for a total of 722 joules.   For stab phlebectomies, local anesthetic was administered at the previously marked varicosities, and tumescent anesthesia was  administered around the vessels.  Greater than 20 stab wounds were made using the tip of an 11 blade. And using the vein hook, the phlebectomies were performed using a hemostat to avulse the varicosities.  Adequate hemostasis was achieved.     Steri strips were applied to the stab wounds and ABD pads and thigh high compression stockings were applied.  Ace wrap bandages were applied over the phlebectomy sites and at the top of the saphenofemoral junction. Blood loss was less than 15 cc.  Discharge instructions reviewed with patient and hardcopy of discharge instructions given to patient to take home. The patient ambulated out of the operating room having tolerated the procedure well.  [id number], MD [facility] Office: [phone]

## 2020-05-01 ENCOUNTER — Ambulatory Visit (INDEPENDENT_AMBULATORY_CARE_PROVIDER_SITE_OTHER): Payer: BC Managed Care – PPO | Admitting: Vascular Surgery

## 2020-05-01 ENCOUNTER — Other Ambulatory Visit: Payer: Self-pay

## 2020-05-01 ENCOUNTER — Encounter: Payer: Self-pay | Admitting: Vascular Surgery

## 2020-05-01 ENCOUNTER — Ambulatory Visit (HOSPITAL_COMMUNITY)
Admission: RE | Admit: 2020-05-01 | Discharge: 2020-05-01 | Disposition: A | Discharge status: Home / Self Care | Payer: BC Managed Care – PPO | Source: Ambulatory Visit | Attending: Vascular Surgery | Admitting: Vascular Surgery

## 2020-05-01 VITALS — BP 159/109 | HR 70 | Temp 97.2°F | Resp 16 | Ht 73.0 in | Wt 216.0 lb

## 2020-05-01 DIAGNOSIS — I83812 Varicose veins of left lower extremities with pain: Secondary | ICD-10-CM

## 2020-05-01 DIAGNOSIS — I82412 Acute embolism and thrombosis of left femoral vein: Secondary | ICD-10-CM

## 2020-05-01 MED ORDER — APIXABAN 5 MG PO TABS: ORAL_TABLET | ORAL | 1 refills | Status: DC

## 2020-05-01 NOTE — Progress Notes (Signed)
   Patient is a 59 year old male who returns for follow-up today.  He underwent laser ablation of the left greater saphenous vein on April 24, 2020.  He still has some's mild swelling in his left leg minimal pain.  He has no shortness of breath or chest pain or hemoptysis.  Past Medical History:  Diagnosis Date  . DVT (deep venous thrombosis) ([facility])   . Myocardial infarction ([facility])   . Ulcer of left ankle [facility])      Current Outpatient Medications on File Prior to Visit  Medication Sig Dispense Refill  . ALPRAZolam (XANAX) 0.25 MG tablet Take 0.5 mg by mouth 2 (two) times daily as needed.    . ASPIRIN LOW DOSE 81 MG EC tablet Take 81 mg by mouth daily. (Patient not taking: Reported on 04/24/2020)    . clopidogrel (PLAVIX) 75 MG tablet Take by mouth. (Patient not taking: Reported on 04/24/2020)    . lisinopril (ZESTRIL) 20 MG tablet Take 20 mg by mouth daily.    . metoprolol tartrate (LOPRESSOR) 100 MG tablet TAKE 1 TABLET BY MOUTH ONCE FOR 1 DOSE, TAKE 2 HOURS PRIOR TO YOUR CT (Patient not taking: Reported on 04/24/2020) 1 tablet 0  . omeprazole (PRILOSEC) 40 MG capsule Take by mouth.    . simvastatin (ZOCOR) 40 MG tablet Take by mouth.     No current facility-administered medications on file prior to visit.    Physical exam:  Vitals:   05/01/20 1033  BP: (!) 159/109  Pulse: 70  Resp: 16  Temp: (!) 97.2 F (36.2 C)  TempSrc: Temporal  SpO2: 100%  Weight: 216 lb (98 kg)  Height: 6\' 1"  (1.854 m)    Left lower extremity mild edema approximately 5 to 10% larger than the right leg  Data: Patient had a duplex ultrasound today of the left greater saphenous vein which shows complete exclusion of this.  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  Assessment: Successful laser ablation left greater saphenous vein with EHIT 2/3  Plan: Findings were discussed with the patient today.  We will place him on Eliquis for 1 month and then repeat his  duplex ultrasound.  Since he is currently on aspirin and Plavix.  I have asked him to hold his Plavix until we complete his course of Eliquis so that he is not on all 3 medications at once.  , MD [facility] [place] Office: [phone]

## 2020-05-02 ENCOUNTER — Ambulatory Visit: Payer: BC Managed Care – PPO | Admitting: Cardiology

## 2020-05-02 ENCOUNTER — Encounter: Payer: Self-pay | Admitting: Cardiology

## 2020-05-02 ENCOUNTER — Other Ambulatory Visit: Payer: Self-pay | Admitting: *Deleted

## 2020-05-02 VITALS — BP 146/86 | HR 77 | Ht 73.0 in | Wt 222.0 lb

## 2020-05-02 DIAGNOSIS — I77810 Thoracic aortic ectasia: Secondary | ICD-10-CM | POA: Diagnosis not present

## 2020-05-02 DIAGNOSIS — I25118 Atherosclerotic heart disease of native coronary artery with other forms of angina pectoris: Secondary | ICD-10-CM

## 2020-05-02 DIAGNOSIS — E782 Mixed hyperlipidemia: Secondary | ICD-10-CM

## 2020-05-02 DIAGNOSIS — I10 Essential (primary) hypertension: Secondary | ICD-10-CM | POA: Diagnosis not present

## 2020-05-02 DIAGNOSIS — I83812 Varicose veins of left lower extremities with pain: Secondary | ICD-10-CM

## 2020-05-02 MED ORDER — ROSUVASTATIN CALCIUM 40 MG PO TABS: 40.0000 mg | ORAL_TABLET | Freq: Every day | ORAL | 3 refills | Status: DC

## 2020-05-02 MED ORDER — EZETIMIBE 10 MG PO TABS: 10.0000 mg | ORAL_TABLET | Freq: Every day | ORAL | 3 refills | Status: DC

## 2020-05-02 NOTE — Progress Notes (Signed)
Cardiology Office Note:    Date:  05/02/2020   ID:  [name], DOB [birth year], MRN [id number]  PCP:  Wilmer Floor., MD  Cardiologist:  Norman Herrlich, MD    Referring MD: Wilmer Floor., MD    ASSESSMENT:    1. Coronary artery disease of native artery of native heart with stable angina pectoris (HCC)   2. Essential hypertension   3. Mixed hyperlipidemia   4. Ascending aorta dilatation (HCC)    PLAN:    In order of problems listed above:  1. I reviewed his cardiac CTA with him he has a moderate stenosis normal FFR not having angina and I would continue his current medical therapy. 2. Continue current treatment 3. Intensify therapy high intensity statin and Zetia reassess in 6 weeks if not at target PCSK9 inhibitor 4. And need to follow-up CT 6 to 12 months   Next appointment: 3 months   Medication Adjustments/Labs and Tests Ordered: Current medicines are reviewed at length with the patient today.  Concerns regarding medicines are outlined above.  Orders Placed This Encounter  Procedures  . Lipid panel  . Lipoprotein A (LPA)   Meds ordered this encounter  Medications  . rosuvastatin (CRESTOR) 40 MG tablet    Sig: Take 1 tablet (40 mg total) by mouth daily.    Dispense:  90 tablet    Refill:  3  . ezetimibe (ZETIA) 10 MG tablet    Sig: Take 1 tablet (10 mg total) by mouth daily.    Dispense:  90 tablet    Refill:  3    No chief complaint on file.   History of Present Illness:    John Jimenez is a 59 y.o. male with a hx of mild CAD hypertension dyslipidemia and COPD at the request of Wilmer Floor., MD.  He had seen methan 3 years ago 11/30/2016 at [facility] cardiology. He had left heart catheterization performed 05/03/2017with  the apex left ventricle was hypokinetic with 40% stenosis distal LAD 20% stenosis in the midsegment and no other areas of stenosis noted.  Ejection fraction was estimated at 60%.  He has a history of post thrombotic  syndrome with chronic lower extremity edema and venous insufficiency class V. He was last seen 01/29/2020. Compliance with diet, lifestyle and medications: Yes  His cardiac CTA showed a high calcium score 308 and high percentile 88% for age and sex.  He had moderate stenosis in the mid LAD 50 to 69% with a high risk CT appearance and mildly to moderately dilated ascending aorta.  FFR was normal.  He also had enlargement of his ascending aorta and has hypertension which is now treated with ACE inhibitor and beta-blocker.  He has familial hyperlipidemia and intensified lipid-lowering therapy will get a high intensity statin Zetia check lipids in 6 weeks and if LDL is above 70 I would like to start him on PCSK9 therapy and his goal should really be an LDL less than 55.  He is very aware of his cardiovascular risk he is content on stopping smoking he has placed himself on a patch 14 mg and use gum or lozenge and he is down to half pack of cigarettes per day. Past Medical History:  Diagnosis Date  . DVT (deep venous thrombosis) (HCC)   . Myocardial infarction (HCC)   . Ulcer of left ankle [facility])     Past Surgical History:  Procedure Laterality Date  . ENDOVENOUS ABLATION SAPHENOUS VEIN W/ LASER  Left 04/24/2020   endovenous laser ablation left greater saphenous vein and stab phlebectomy >20 incisions left leg by Fabienne Bruns MD   . ROTATOR CUFF REPAIR      Current Medications: Current Meds  Medication Sig  . ALPRAZolam (XANAX) 0.25 MG tablet Take 0.5 mg by mouth 2 (two) times daily as needed.  Marland Kitchen apixaban (ELIQUIS) 5 MG TABS tablet Take 2 tablets (10mg ) twice daily for 7 days, then 1 tablet (5mg ) twice daily  . ASPIRIN LOW DOSE 81 MG EC tablet Take 81 mg by mouth daily.   . clopidogrel (PLAVIX) 75 MG tablet Take by mouth.   lisinopril (ZESTRIL) 20 MG tablet Take 20 mg by mouth daily.  . metoprolol tartrate (LOPRESSOR) 100 MG tablet TAKE 1 TABLET BY MOUTH ONCE FOR 1 DOSE, TAKE 2 HOURS PRIOR TO  YOUR CT  . omeprazole (PRILOSEC) 40 MG capsule Take by mouth.  . simvastatin (ZOCOR) 40 MG tablet Take by mouth.     Allergies:   Patient has no known allergies.   Social History   Socioeconomic History  . Marital status: Married    Spouse name: Not on file  . Number of children: Not on file  . Years of education: Not on file  . Highest education level: Not on file  Occupational History  . Not on file  Tobacco Use  . Smoking status: Current Every Day Smoker    Packs/day: 1.00  . Smokeless tobacco: Never Used  Substance and Sexual Activity  . Alcohol use: Yes    Comment: occassional  . Drug use: Not on file  . Sexual activity: Not on file  Other Topics Concern  . Not on file  Social History Narrative  . Not on file   Social Determinants of Health   Financial Resource Strain:   . Difficulty of Paying Living Expenses:   Food Insecurity:   . Worried About in the Last Year:   . Marland Kitchen in the Last Year:   Transportation Needs:   . Programme researcher, broadcasting/film/video (Medical):   Barista Lack of Transportation (Non-Medical):   Physical Activity:   . Days of Exercise per Week:   . Minutes of Exercise per Session:   Stress:   . Feeling of Stress :   Social Connections:   . Frequency of Communication with Friends and Family:   . Frequency of Social Gatherings with Friends and Family:   . Attends Religious Services:   . Active Member of Clubs or Organizations:   . Attends Freight forwarder Meetings:   Marland Kitchen Marital Status:      Family History: The patient's family history includes Breast cancer in his mother; Diabetes in his father; Heart attack in his father and paternal grandfather; Heart disease in his father; Hyperlipidemia in his father and mother; Hypertension in his father, maternal grandmother, and mother. ROS:   Please see the history of present illness.    All other systems reviewed and are negative.  EKGs/Labs/Other Studies Reviewed:    The  following studies were reviewed today:    Recent Labs: 01/29/2020: BUN 8; Creatinine, Ser 0.76; Potassium 3.6; Sodium 139  Recent Lipid Panel No results found for: CHOL, TRIG, HDL, CHOLHDL, VLDL, LDLCALC, LDLDIRECT  Physical Exam:    VS:  BP (!) 146/86   Pulse 77   Ht 6\' 1"  (1.854 m)   Wt 222 lb (100.7 kg)   SpO2 95%   BMI 29.29 kg/m  Wt Readings from Last 3 Encounters:  05/02/20 222 lb (100.7 kg)  05/01/20 216 lb (98 kg)  04/24/20 (!) 216 lb (98 kg)     GEN:  Well nourished, well developed in no acute distress HEENT: Normal NECK: No JVD; No carotid bruits LYMPHATICS: No lymphadenopathy CARDIAC: RRR, no murmurs, rubs, gallops RESPIRATORY:  Clear to auscultation without rales, wheezing or rhonchi  ABDOMEN: Soft, non-tender, non-distended MUSCULOSKELETAL:  No edema; No deformity  SKIN: Warm and dry NEUROLOGIC:  Alert and oriented x 3 PSYCHIATRIC:  Normal affect    Signed, Norman Herrlich, MD  05/02/2020 2:13 PM    [facility]

## 2020-05-02 NOTE — Patient Instructions (Addendum)
Medication Instructions:  Your physician has recommended you make the following change in your medication:  STOP: Simvastatin  START: Rosuvastatin 40 mg take one tablet by mouth daily.  START: Zetia 10 mg take one tablet by mouth daily.  *If you need a refill on your cardiac medications before your next appointment, please call your pharmacy*   Lab Work: Your physician recommends that you return for lab work in: 6 weeks Lipids, LPA If you have labs (blood work) drawn today and your tests are completely normal, you will receive your results only by: Marland Kitchen MyChart Message (if you have MyChart) OR . A paper copy in the mail If you have any lab test that is abnormal or we need to change your treatment, we will call you to review the results.   Testing/Procedures: None   Follow-Up: At [facility], you and your health needs are our priority.  As part of our continuing mission to provide you with exceptional heart care, we have created designated Provider Care Teams.  These Care Teams include your primary Cardiologist (physician) and Advanced Practice Providers (APPs -  Physician Assistants and Nurse Practitioners) who all work together to provide you with the care you need, when you need it.  We recommend signing up for the patient portal called "MyChart".  Sign up information is provided on this After Visit Summary.  MyChart is used to connect with patients for Virtual Visits (Telemedicine).  Patients are able to view lab/test results, encounter notes, upcoming appointments, etc.  Non-urgent messages can be sent to your provider as well.   To learn more about what you can do with MyChart, go to [email or web address].    Your next appointment:   3 month(s)  The format for your next appointment:   In Person  Provider:   Norman Herrlich, MD   Other Instructions Use over the counter 14 mg patch and nicotine gum.

## 2020-06-06 ENCOUNTER — Ambulatory Visit (HOSPITAL_COMMUNITY)
Admission: RE | Admit: 2020-06-06 | Discharge: 2020-06-06 | Disposition: A | Discharge status: Home / Self Care | Payer: BC Managed Care – PPO | Source: Ambulatory Visit | Attending: Vascular Surgery | Admitting: Vascular Surgery

## 2020-06-06 ENCOUNTER — Encounter: Payer: Self-pay | Admitting: Vascular Surgery

## 2020-06-06 ENCOUNTER — Ambulatory Visit (INDEPENDENT_AMBULATORY_CARE_PROVIDER_SITE_OTHER): Payer: BC Managed Care – PPO | Admitting: Vascular Surgery

## 2020-06-06 ENCOUNTER — Other Ambulatory Visit: Payer: Self-pay

## 2020-06-06 VITALS — BP 144/95 | HR 68 | Temp 97.9°F | Resp 20 | Ht 73.0 in | Wt 218.0 lb

## 2020-06-06 DIAGNOSIS — I83812 Varicose veins of left lower extremities with pain: Secondary | ICD-10-CM | POA: Diagnosis not present

## 2020-06-06 NOTE — Progress Notes (Signed)
Patient is a 59 year old male returns for follow-up today.  He underwent laser ablation of his left greater saphenous vein April 24, 2020.  This was complicated by some partial thrombus in the common femoral vein he was placed on Eliquis for 1 month.  He reports he still has some swelling in his left leg but it is overall improved from preprocedure.  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  Physical exam:  Vitals:   06/06/20 1438  BP: (!) 144/95  Pulse: 68  Resp: 20  Temp: 97.9 F (36.6 C)  SpO2: 98%  Weight: 218 lb (98.9 kg)  Height: 6\' 1"  (1.854 m)    Chronic hemosiderin staining left gaiter area.  Edema left leg approximately 15% larger than the right leg but slightly improved from preoperatively.  Data: Patient had a repeat duplex ultrasound today which shows the thrombus that was extending into his common femoral vein has resolved.  There is partial thrombosis of the left greater saphenous vein with it being more dense in the mid segment.  Assessment: Doing well status post laser ablation left greater saphenous vein.  I encouraged the patient to continue to wear his thigh-high compression stockings to continue to form scar tissue in the left greater saphenous vein to keep this out of the circuit.  We also discussed that he will probably continue to have chronic swelling due to his popliteal DVT previously.  No further intervention planned at this point.  He seems satisfied that his swelling has improved somewhat with the laser ablation and stab avulsions.  Plan: Patient will continue wear his thigh-high compression stockings.  He will follow up on an as-needed basis.  , MD [facility] Office: [phone]

## 2020-06-19 ENCOUNTER — Ambulatory Visit: Payer: BC Managed Care – PPO | Admitting: Vascular Surgery

## 2020-06-19 ENCOUNTER — Encounter (HOSPITAL_COMMUNITY): Payer: BC Managed Care – PPO

## 2020-08-05 DIAGNOSIS — I219 Acute myocardial infarction, unspecified: Secondary | ICD-10-CM | POA: Insufficient documentation

## 2020-08-05 DIAGNOSIS — I82409 Acute embolism and thrombosis of unspecified deep veins of unspecified lower extremity: Secondary | ICD-10-CM | POA: Insufficient documentation

## 2020-08-05 DIAGNOSIS — L97329 Non-pressure chronic ulcer of left ankle with unspecified severity: Secondary | ICD-10-CM | POA: Insufficient documentation

## 2020-08-08 ENCOUNTER — Ambulatory Visit: Payer: BC Managed Care – PPO | Admitting: Cardiology

## 2020-08-08 NOTE — Progress Notes (Deleted)
Cardiology Office Note:    Date:  08/08/2020   ID:  [name], DOB May 20, 1961, MRN [id number]  PCP:  Wilmer Floor., MD  Cardiologist:  Norman Herrlich, MD    Referring MD: Wilmer Floor., MD    ASSESSMENT:    No diagnosis found. PLAN:    In order of problems listed above:  1. ***   Next appointment: ***   Medication Adjustments/Labs and Tests Ordered: Current medicines are reviewed at length with the patient today.  Concerns regarding medicines are outlined above.  No orders of the defined types were placed in this encounter.  No orders of the defined types were placed in this encounter.   No chief complaint on file.   History of Present Illness:    John Jimenez is a 59 y.o. male with a hx of CAD hypertension dyslipidemia and COPD he was 05/02/2020 last seen .  Left heart catheterization May 2017 showed apical hypokinesia and mild nonobstructive CAD ejection fraction estimated 60%.  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  Cardiac CTA 04/08/2020 shows a high calcium score 308, 88th percentile with moderate stenosis mid LAD 50 to 69% and mild to moderate enlargement of the ascending aorta. Compliance with diet, lifestyle and medications: *** Past Medical History:  Diagnosis Date  . COPD (chronic obstructive pulmonary disease) (HCC) 01/29/2016  . DVT (deep venous thrombosis) (HCC)   . GERD (gastroesophageal reflux disease) 01/29/2016  . Myocardial infarction (HCC)   . Tobacco abuse 01/29/2016  . Ulcer of left ankle [facility])     Past Surgical History:  Procedure Laterality Date  . ENDOVENOUS ABLATION SAPHENOUS VEIN W/ LASER Left 04/24/2020   endovenous laser ablation left greater saphenous vein and stab phlebectomy >20 incisions left leg by Fabienne Bruns MD   . ROTATOR CUFF REPAIR      Current Medications: No outpatient medications have been marked as taking for the 08/08/20 encounter  (Appointment) with Baldo Daub, MD.     Allergies:   Patient has no known allergies.   Social History   Socioeconomic History  . Marital status: Married    Spouse name: Not on file  . Number of children: Not on file  . Years of education: Not on file  . Highest education level: Not on file  Occupational History  . Not on file  Tobacco Use  . Smoking status: Current Every Day Smoker    Packs/day: 1.00  . Smokeless tobacco: Never Used  Vaping Use  . Vaping Use: Never used  Substance and Sexual Activity  . Alcohol use: Yes    Comment: occassional  . Drug use: Not on file  . Sexual activity: Not on file  Other Topics Concern  . Not on file  Social History Narrative  . Not on file   Social Determinants of Health   Financial Resource Strain:   . Difficulty of Paying Living Expenses: Not on file  Food Insecurity:   . Worried About Programme researcher, broadcasting/film/video in the Last Year: Not on file  . Ran Out of Food in the Last Year: Not on file  Transportation Needs:   . Lack of Transportation (Medical): Not on file  . Lack of Transportation (Non-Medical): Not on file  Physical Activity:   . Days of Exercise per Week: Not on file  . Minutes of Exercise per Session: Not on file  Stress:   . Feeling of  Stress : Not on file  Social Connections:   . Frequency of Communication with Friends and Family: Not on file  . Frequency of Social Gatherings with Friends and Family: Not on file  . Attends Religious Services: Not on file  . Active Member of Clubs or Organizations: Not on file  . Attends Banker Meetings: Not on file  . Marital Status: Not on file     Family History: The patient's ***family history includes Breast cancer in his mother; Diabetes in his father; Heart attack in his father and paternal grandfather; Heart disease in his father; Hyperlipidemia in his father and mother; Hypertension in his father, maternal grandmother, and mother. ROS:   Please see the  history of present illness.    All other systems reviewed and are negative.  EKGs/Labs/Other Studies Reviewed:    The following studies were reviewed today:  EKG:  EKG ordered today and personally reviewed.  The ekg ordered today demonstrates ***  Recent Labs: 01/29/2020: BUN 8; Creatinine, Ser 0.76; Potassium 3.6; Sodium 139  Recent Lipid Panel No results found for: CHOL, TRIG, HDL, CHOLHDL, VLDL, LDLCALC, LDLDIRECT  Physical Exam:    VS:  There were no vitals taken for this visit.    Wt Readings from Last 3 Encounters:  06/06/20 218 lb (98.9 kg)  05/02/20 222 lb (100.7 kg)  05/01/20 216 lb (98 kg)     GEN: *** Well nourished, well developed in no acute distress HEENT: Normal NECK: No JVD; No carotid bruits LYMPHATICS: No lymphadenopathy CARDIAC: ***RRR, no murmurs, rubs, gallops RESPIRATORY:  Clear to auscultation without rales, wheezing or rhonchi  ABDOMEN: Soft, non-tender, non-distended MUSCULOSKELETAL:  No edema; No deformity  SKIN: Warm and dry NEUROLOGIC:  Alert and oriented x 3 PSYCHIATRIC:  Normal affect    Signed, Norman Herrlich, MD  08/08/2020 7:45 AM    [facility]

## 2020-09-26 IMAGING — CT CT HEART MORP W/ CTA COR W/ SCORE W/ CA W/CM &/OR W/O CM
4 of 7 series · 8 of 20 positions shown, 9 images · IV contrast (APPLIED)
Comparison: None.
COMPARISON: None.

Addendum:
EXAM:
OVER-READ INTERPRETATION  CT CHEST

The following report is an over-read performed by radiologist Dr.
Yoel Tiger [REDACTED] on 04/08/2020. This over-read
does not include interpretation of cardiac or coronary anatomy or
pathology. The coronary CTA interpretation by the cardiologist is
attached.
TECHNIQUE: The patient was scanned on a Phillips Force scanner.

[Series 6: best diast 74 % · axial · 0.39mm/px · z∈[+226,+274]mm · 2 of 361 slices shown, 3 images]
[im 121/361  vessel]
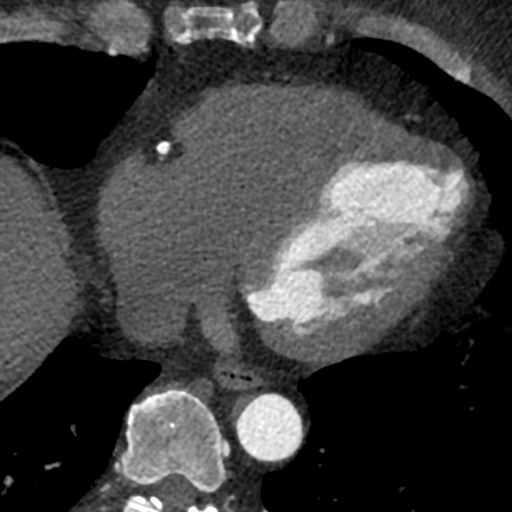
[im 121/361  lung]
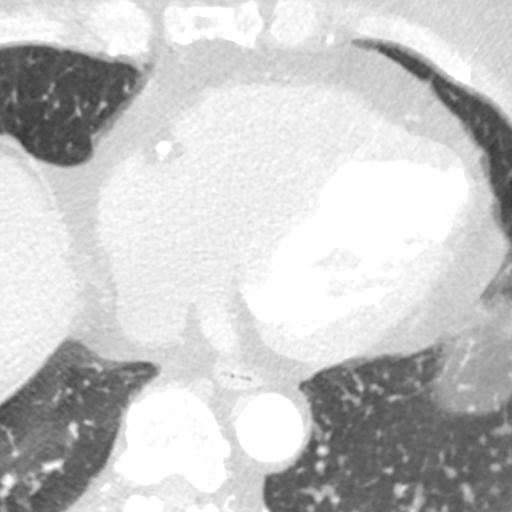
[im 241/361  vessel]
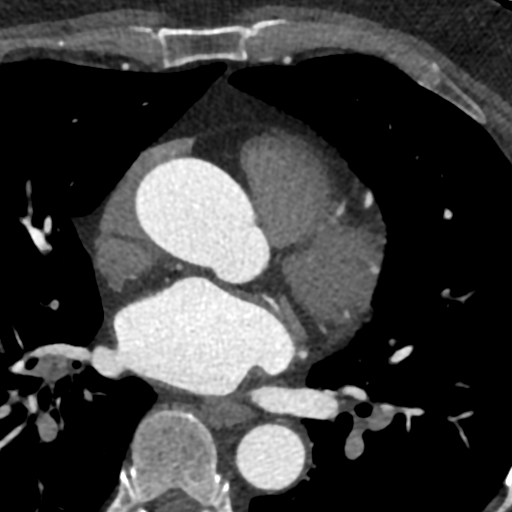

[Series 7: best syst 33 % · axial · 0.39mm/px · z∈[+226,+274]mm · 2 of 361 slices shown]
[im 121/361  vessel]
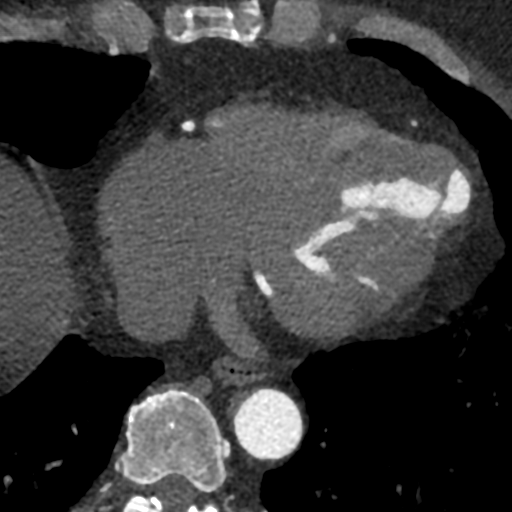
[im 241/361  vessel]
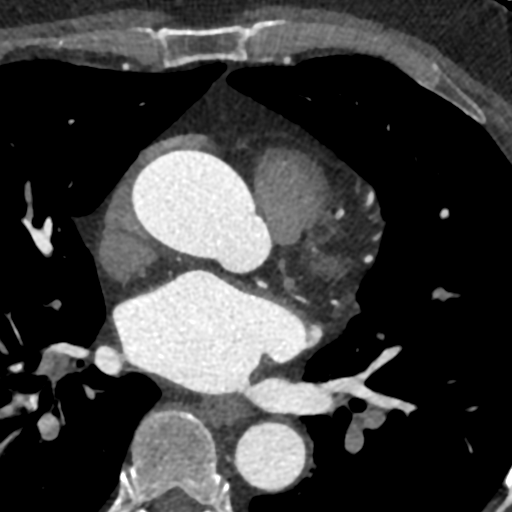

[Series 8: ts diast sharp 74 % · axial · 0.39mm/px · z∈[+226,+274]mm · 2 of 361 slices shown]
[im 121/361  lung]
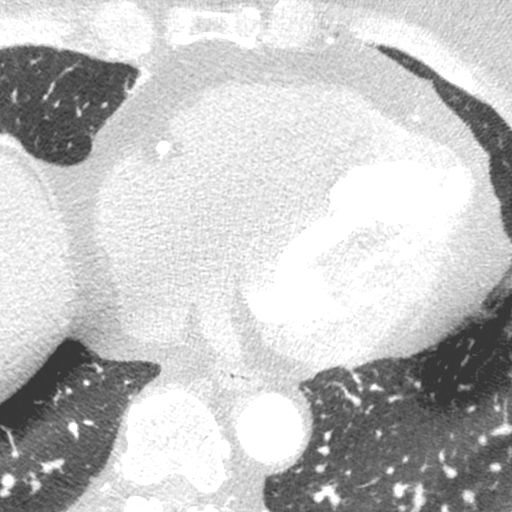
[im 241/361  lung]
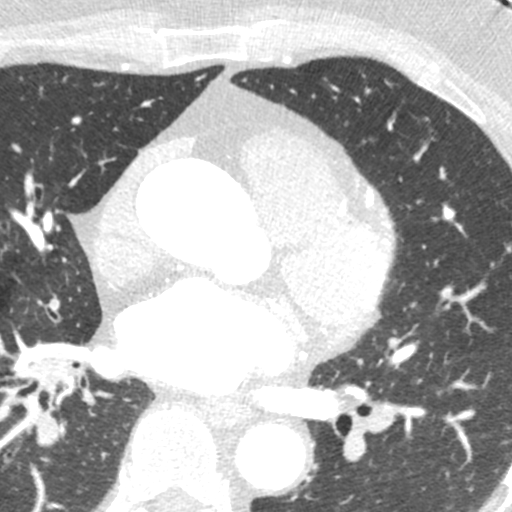

[Series 9: ts syst sharp 33 % · axial · 0.39mm/px · z∈[+226,+274]mm · 2 of 361 slices shown]
[im 121/361  lung]
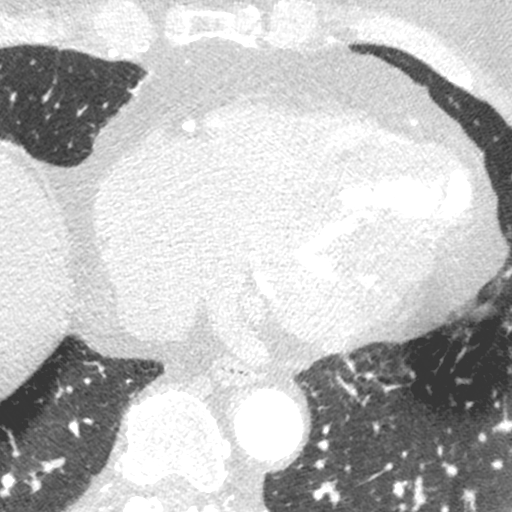
[im 241/361  lung]
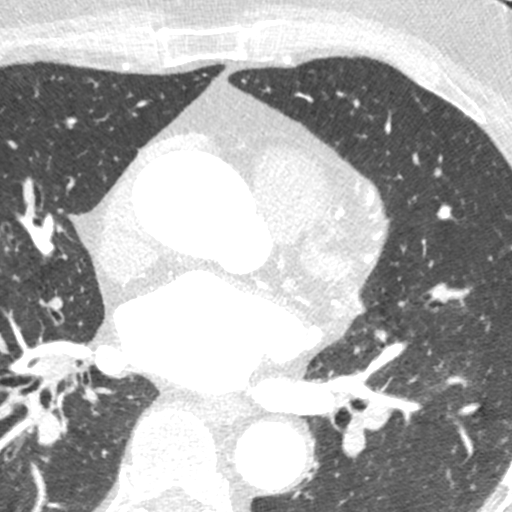

[8 of 20 positions shown; findings below may reference images not displayed]

FINDINGS: Vascular: Aneurysmal dilatation of the ascending thoracic aorta,
cm maximally. Heart is borderline in size.

Mediastinum/Nodes: No adenopathy.

Lungs/Pleura: Visualized lungs clear.  No effusions.

Upper Abdomen: Imaging into the upper abdomen shows no acute
findings.

Musculoskeletal: Chest wall soft tissues are unremarkable. No acute
bony abnormality.
IMPRESSION: 4.4 cm ascending thoracic aortic aneurysm. Recommend annual imaging
followup by CTA or MRA. This recommendation follows 8131
ACCF/AHA/AATS/ACR/ASA/SCA/JIM/DELORES/DUBLJANIC/ABDULBARI Guidelines for the
Diagnosis and Management of Patients with Thoracic Aortic Disease.
Circulation. 8131; 121: E266-e369. Aortic aneurysm NOS (BFNM0-GDO.G)

EXAM:
Cardiac/Coronary  CT
FINDINGS: A 120 kV prospective scan was triggered in the descending thoracic
aorta at 111 HU's. Axial non-contrast 3 mm slices were carried out
through the heart. The data set was analyzed on a dedicated work
station and scored using the Agatson method. Gantry rotation speed
was 250 msecs and collimation was .6 mm. No beta blockade and 0.8 mg
of sl NTG was given. The 3D data set was reconstructed in 5%
intervals of the 67-82 % of the R-R cycle. Diastolic phases were
analyzed on a dedicated work station using MPR, MIP and VRT modes.
The patient received 80 cc of contrast.

Aorta: Mild to moderately dilated ascending aorta at 44mm with no
calcifications. No dissection.

Aortic Valve:  Trileaflet.  Minimal calcifications.

Coronary Arteries:  Normal coronary origin.  Right dominance.

RCA is a large dominant artery that gives rise to PDA and PLVB.
There is minimal calcified plaque in the proximal RCA with
associated stenosis of 0-24% followed by mild non-calcified plaque
just distal to calcified lesion with associated stenosis of 25-49%.

Left main is a large artery that gives rise to LAD, Ramus and LCX
arteries. There is no plaque.

LAD is a large vessel that gives rise to a large branching D1 and
small D2 vessels. There is minimal calcified plaque in the ostial
LAD with associated stenosis of 0-24%. There is moderate mixed
plaque in the mid LAD at the takeoff of a large branching diagonal 1
with associated stenosis of 50-69%. This plaque has a low
attenuation core < 40HU with napkin ring spotty calcification and is
considered a high risk lesion. There is minimal calcified plaque in
the mid LAD with associated stenosis of 0-24%.

The Ramus is a moderate sized vessel with no plaque.

LCX is a non-dominant artery that gives rise to a moderate sized OM1
branch. There is no plaque.

Other findings:

Normal pulmonary vein drainage into the left atrium.

Normal let atrial appendage without a thrombus.

Normal size of the pulmonary artery.
IMPRESSION: 1. Coronary calcium score of 308. This was 88th percentile for age
and sex matched control.

2.  Normal coronary origin with right dominance.

3. Moderate atherosclerosis of the mid LAD with high risk lesion.
CAD-RADS 3.

4. Mildly to moderately dilated ascending aorta. Recommend yearly
followup.

4. Consider symptom-guided anti-ischemic and preventive
pharmacotherapy as well as risk factor modification per
guideline-directed care.

5.  This study has been submitted for FFR analysis.

Marifer Dhakal

*** End of Addendum ***
EXAM:
OVER-READ INTERPRETATION  CT CHEST

The following report is an over-read performed by radiologist Dr.
Yoel Tiger [REDACTED] on 04/08/2020. This over-read
does not include interpretation of cardiac or coronary anatomy or
pathology. The coronary CTA interpretation by the cardiologist is
attached.
FINDINGS: Vascular: Aneurysmal dilatation of the ascending thoracic aorta,
cm maximally. Heart is borderline in size.

Mediastinum/Nodes: No adenopathy.

Lungs/Pleura: Visualized lungs clear.  No effusions.

Upper Abdomen: Imaging into the upper abdomen shows no acute
findings.

Musculoskeletal: Chest wall soft tissues are unremarkable. No acute
bony abnormality.
IMPRESSION: 4.4 cm ascending thoracic aortic aneurysm. Recommend annual imaging
followup by CTA or MRA. This recommendation follows 8131
ACCF/AHA/AATS/ACR/ASA/SCA/JIM/DELORES/DUBLJANIC/ABDULBARI Guidelines for the
Diagnosis and Management of Patients with Thoracic Aortic Disease.
Circulation. 8131; 121: [id number]. Aortic aneurysm NOS (BFNM0-GDO.G)

## 2020-10-09 DIAGNOSIS — F411 Generalized anxiety disorder: Secondary | ICD-10-CM | POA: Diagnosis not present

## 2020-10-09 DIAGNOSIS — Z6829 Body mass index (BMI) 29.0-29.9, adult: Secondary | ICD-10-CM | POA: Diagnosis not present

## 2020-10-09 DIAGNOSIS — Z87891 Personal history of nicotine dependence: Secondary | ICD-10-CM | POA: Diagnosis not present

## 2020-10-28 ENCOUNTER — Other Ambulatory Visit: Payer: Self-pay

## 2020-12-31 DIAGNOSIS — Z6829 Body mass index (BMI) 29.0-29.9, adult: Secondary | ICD-10-CM | POA: Diagnosis not present

## 2020-12-31 DIAGNOSIS — Z79899 Other long term (current) drug therapy: Secondary | ICD-10-CM | POA: Diagnosis not present

## 2020-12-31 DIAGNOSIS — Z87891 Personal history of nicotine dependence: Secondary | ICD-10-CM | POA: Diagnosis not present

## 2020-12-31 DIAGNOSIS — Z131 Encounter for screening for diabetes mellitus: Secondary | ICD-10-CM | POA: Diagnosis not present

## 2020-12-31 DIAGNOSIS — Z0000 Encounter for general adult medical examination without abnormal findings: Secondary | ICD-10-CM | POA: Diagnosis not present

## 2020-12-31 DIAGNOSIS — Z125 Encounter for screening for malignant neoplasm of prostate: Secondary | ICD-10-CM | POA: Diagnosis not present

## 2020-12-31 DIAGNOSIS — E785 Hyperlipidemia, unspecified: Secondary | ICD-10-CM | POA: Diagnosis not present

## 2020-12-31 DIAGNOSIS — Z1331 Encounter for screening for depression: Secondary | ICD-10-CM | POA: Diagnosis not present

## 2021-01-31 DIAGNOSIS — R7989 Other specified abnormal findings of blood chemistry: Secondary | ICD-10-CM | POA: Diagnosis not present

## 2021-02-05 DIAGNOSIS — B079 Viral wart, unspecified: Secondary | ICD-10-CM | POA: Diagnosis not present

## 2021-02-05 DIAGNOSIS — Z6830 Body mass index (BMI) 30.0-30.9, adult: Secondary | ICD-10-CM | POA: Diagnosis not present

## 2021-02-05 DIAGNOSIS — R5383 Other fatigue: Secondary | ICD-10-CM | POA: Diagnosis not present

## 2021-02-05 DIAGNOSIS — Z79899 Other long term (current) drug therapy: Secondary | ICD-10-CM | POA: Diagnosis not present

## 2021-02-07 ENCOUNTER — Encounter: Payer: Self-pay | Admitting: Vascular Surgery

## 2021-05-05 ENCOUNTER — Other Ambulatory Visit: Payer: Self-pay | Admitting: Cardiology

## 2021-05-05 DIAGNOSIS — I10 Essential (primary) hypertension: Secondary | ICD-10-CM

## 2021-05-05 DIAGNOSIS — E782 Mixed hyperlipidemia: Secondary | ICD-10-CM

## 2021-05-05 DIAGNOSIS — I25118 Atherosclerotic heart disease of native coronary artery with other forms of angina pectoris: Secondary | ICD-10-CM

## 2021-10-02 DIAGNOSIS — F411 Generalized anxiety disorder: Secondary | ICD-10-CM | POA: Diagnosis not present

## 2021-10-02 DIAGNOSIS — I82439 Acute embolism and thrombosis of unspecified popliteal vein: Secondary | ICD-10-CM | POA: Diagnosis not present

## 2021-10-02 DIAGNOSIS — B351 Tinea unguium: Secondary | ICD-10-CM | POA: Diagnosis not present

## 2021-10-02 DIAGNOSIS — Z6830 Body mass index (BMI) 30.0-30.9, adult: Secondary | ICD-10-CM | POA: Diagnosis not present

## 2022-01-06 DIAGNOSIS — Z79899 Other long term (current) drug therapy: Secondary | ICD-10-CM | POA: Diagnosis not present

## 2022-01-06 DIAGNOSIS — Z125 Encounter for screening for malignant neoplasm of prostate: Secondary | ICD-10-CM | POA: Diagnosis not present

## 2022-01-06 DIAGNOSIS — Z131 Encounter for screening for diabetes mellitus: Secondary | ICD-10-CM | POA: Diagnosis not present

## 2022-01-06 DIAGNOSIS — E785 Hyperlipidemia, unspecified: Secondary | ICD-10-CM | POA: Diagnosis not present

## 2022-01-06 DIAGNOSIS — Z0000 Encounter for general adult medical examination without abnormal findings: Secondary | ICD-10-CM | POA: Diagnosis not present

## 2022-01-06 DIAGNOSIS — Z87891 Personal history of nicotine dependence: Secondary | ICD-10-CM | POA: Diagnosis not present

## 2022-01-06 DIAGNOSIS — Z6830 Body mass index (BMI) 30.0-30.9, adult: Secondary | ICD-10-CM | POA: Diagnosis not present

## 2022-02-25 ENCOUNTER — Encounter: Payer: Self-pay | Admitting: Internal Medicine

## 2022-02-25 DIAGNOSIS — R079 Chest pain, unspecified: Secondary | ICD-10-CM | POA: Diagnosis not present

## 2022-02-25 DIAGNOSIS — I10 Essential (primary) hypertension: Secondary | ICD-10-CM | POA: Diagnosis not present

## 2022-02-25 DIAGNOSIS — E785 Hyperlipidemia, unspecified: Secondary | ICD-10-CM | POA: Diagnosis not present

## 2022-02-25 DIAGNOSIS — R0789 Other chest pain: Secondary | ICD-10-CM | POA: Diagnosis not present

## 2022-02-25 DIAGNOSIS — J449 Chronic obstructive pulmonary disease, unspecified: Secondary | ICD-10-CM | POA: Diagnosis not present

## 2022-02-25 DIAGNOSIS — I214 Non-ST elevation (NSTEMI) myocardial infarction: Secondary | ICD-10-CM | POA: Diagnosis not present

## 2022-02-25 DIAGNOSIS — R072 Precordial pain: Secondary | ICD-10-CM | POA: Diagnosis not present

## 2022-02-26 ENCOUNTER — Inpatient Hospital Stay (HOSPITAL_COMMUNITY)
Admission: AD | Admit: 2022-02-26 | Discharge: 2022-02-27 | DRG: 287 | Disposition: A | Discharge status: Home / Self Care | Payer: BC Managed Care – PPO | Source: Other Acute Inpatient Hospital | Attending: Cardiology | Admitting: Cardiology

## 2022-02-26 ENCOUNTER — Inpatient Hospital Stay (HOSPITAL_COMMUNITY)
Admission: AD | Disposition: A | Discharge status: Home / Self Care | Payer: Self-pay | Source: Other Acute Inpatient Hospital | Attending: Cardiology

## 2022-02-26 DIAGNOSIS — I25119 Atherosclerotic heart disease of native coronary artery with unspecified angina pectoris: Principal | ICD-10-CM | POA: Diagnosis present

## 2022-02-26 DIAGNOSIS — F17210 Nicotine dependence, cigarettes, uncomplicated: Secondary | ICD-10-CM | POA: Diagnosis present

## 2022-02-26 DIAGNOSIS — R600 Localized edema: Secondary | ICD-10-CM | POA: Diagnosis not present

## 2022-02-26 DIAGNOSIS — J449 Chronic obstructive pulmonary disease, unspecified: Secondary | ICD-10-CM | POA: Diagnosis present

## 2022-02-26 DIAGNOSIS — M1990 Unspecified osteoarthritis, unspecified site: Secondary | ICD-10-CM | POA: Diagnosis not present

## 2022-02-26 DIAGNOSIS — K219 Gastro-esophageal reflux disease without esophagitis: Secondary | ICD-10-CM | POA: Diagnosis not present

## 2022-02-26 DIAGNOSIS — I248 Other forms of acute ischemic heart disease: Secondary | ICD-10-CM | POA: Diagnosis present

## 2022-02-26 DIAGNOSIS — R072 Precordial pain: Secondary | ICD-10-CM | POA: Diagnosis not present

## 2022-02-26 DIAGNOSIS — E785 Hyperlipidemia, unspecified: Secondary | ICD-10-CM | POA: Diagnosis not present

## 2022-02-26 DIAGNOSIS — I252 Old myocardial infarction: Secondary | ICD-10-CM | POA: Diagnosis not present

## 2022-02-26 DIAGNOSIS — E7800 Pure hypercholesterolemia, unspecified: Secondary | ICD-10-CM | POA: Diagnosis not present

## 2022-02-26 DIAGNOSIS — Z7982 Long term (current) use of aspirin: Secondary | ICD-10-CM | POA: Diagnosis not present

## 2022-02-26 DIAGNOSIS — E7849 Other hyperlipidemia: Secondary | ICD-10-CM | POA: Diagnosis present

## 2022-02-26 DIAGNOSIS — Z7902 Long term (current) use of antithrombotics/antiplatelets: Secondary | ICD-10-CM | POA: Diagnosis not present

## 2022-02-26 DIAGNOSIS — I2510 Atherosclerotic heart disease of native coronary artery without angina pectoris: Secondary | ICD-10-CM

## 2022-02-26 DIAGNOSIS — I10 Essential (primary) hypertension: Secondary | ICD-10-CM | POA: Diagnosis present

## 2022-02-26 DIAGNOSIS — Z79899 Other long term (current) drug therapy: Secondary | ICD-10-CM | POA: Diagnosis not present

## 2022-02-26 DIAGNOSIS — I77810 Thoracic aortic ectasia: Secondary | ICD-10-CM | POA: Diagnosis present

## 2022-02-26 DIAGNOSIS — R079 Chest pain, unspecified: Principal | ICD-10-CM | POA: Diagnosis present

## 2022-02-26 DIAGNOSIS — Z720 Tobacco use: Secondary | ICD-10-CM | POA: Diagnosis present

## 2022-02-26 DIAGNOSIS — Z86718 Personal history of other venous thrombosis and embolism: Secondary | ICD-10-CM | POA: Diagnosis not present

## 2022-02-26 DIAGNOSIS — R0789 Other chest pain: Secondary | ICD-10-CM | POA: Diagnosis not present

## 2022-02-26 DIAGNOSIS — I87009 Postthrombotic syndrome without complications of unspecified extremity: Secondary | ICD-10-CM | POA: Diagnosis not present

## 2022-02-26 DIAGNOSIS — Z20822 Contact with and (suspected) exposure to covid-19: Secondary | ICD-10-CM | POA: Diagnosis not present

## 2022-02-26 DIAGNOSIS — I200 Unstable angina: Secondary | ICD-10-CM | POA: Diagnosis not present

## 2022-02-26 DIAGNOSIS — I214 Non-ST elevation (NSTEMI) myocardial infarction: Secondary | ICD-10-CM | POA: Diagnosis not present

## 2022-02-26 HISTORY — PX: LEFT HEART CATH AND CORONARY ANGIOGRAPHY: CATH118249

## 2022-02-26 SURGERY — LEFT HEART CATH AND CORONARY ANGIOGRAPHY
Anesthesia: LOCAL

## 2022-02-26 MED ORDER — SODIUM CHLORIDE 0.9 % WEIGHT BASED INFUSION: 3.0000 mL/kg/h | INTRAVENOUS | Status: AC

## 2022-02-26 MED ORDER — IOHEXOL 350 MG/ML SOLN
INTRAVENOUS | Status: DC | PRN
  Administered 2022-02-26: 45 mL

## 2022-02-26 MED ORDER — SODIUM CHLORIDE 0.9% FLUSH: 3.0000 mL | INTRAVENOUS | Status: DC | PRN

## 2022-02-26 MED ORDER — FENTANYL CITRATE (PF) 100 MCG/2ML IJ SOLN
INTRAMUSCULAR | Status: DC | PRN
Start: 2022-02-26 — End: 2022-02-26
  Administered 2022-02-26: 25 ug via INTRAVENOUS

## 2022-02-26 MED ORDER — MIDAZOLAM HCL 2 MG/2ML IJ SOLN
INTRAMUSCULAR | Status: AC
  Filled 2022-02-26: qty 2

## 2022-02-26 MED ORDER — ASPIRIN 81 MG PO TBEC: 81.0000 mg | DELAYED_RELEASE_TABLET | Freq: Every day | ORAL | Status: DC

## 2022-02-26 MED ORDER — EZETIMIBE 10 MG PO TABS
10.0000 mg | ORAL_TABLET | Freq: Every day | ORAL | Status: DC
  Administered 2022-02-26 – 2022-02-27 (×2): 10 mg via ORAL
  Filled 2022-02-26 (×2): qty 1

## 2022-02-26 MED ORDER — HEPARIN (PORCINE) IN NACL 1000-0.9 UT/500ML-% IV SOLN
INTRAVENOUS | Status: DC | PRN
  Administered 2022-02-26 (×2): 500 mL

## 2022-02-26 MED ORDER — SODIUM CHLORIDE 0.9 % IV SOLN: 250.0000 mL | INTRAVENOUS | Status: DC | PRN

## 2022-02-26 MED ORDER — FENTANYL CITRATE (PF) 100 MCG/2ML IJ SOLN
INTRAMUSCULAR | Status: AC
  Filled 2022-02-26: qty 2

## 2022-02-26 MED ORDER — SODIUM CHLORIDE 0.9 % WEIGHT BASED INFUSION: 1.0000 mL/kg/h | INTRAVENOUS | Status: DC

## 2022-02-26 MED ORDER — SODIUM CHLORIDE 0.9% FLUSH
3.0000 mL | Freq: Two times a day (BID) | INTRAVENOUS | Status: DC
  Administered 2022-02-27: 3 mL via INTRAVENOUS

## 2022-02-26 MED ORDER — HEPARIN SODIUM (PORCINE) 1000 UNIT/ML IJ SOLN
INTRAMUSCULAR | Status: DC | PRN
  Administered 2022-02-26: 5000 [IU] via INTRAVENOUS

## 2022-02-26 MED ORDER — LIDOCAINE HCL (PF) 1 % IJ SOLN
INTRAMUSCULAR | Status: AC
  Filled 2022-02-26: qty 30

## 2022-02-26 MED ORDER — ALPRAZOLAM 0.5 MG PO TABS
0.5000 mg | ORAL_TABLET | Freq: Two times a day (BID) | ORAL | Status: DC | PRN
Start: 2022-02-26 — End: 2022-02-27
  Administered 2022-02-26: 0.5 mg via ORAL
  Filled 2022-02-26: qty 1

## 2022-02-26 MED ORDER — SODIUM CHLORIDE 0.9 % WEIGHT BASED INFUSION
1.0000 mL/kg/h | INTRAVENOUS | Status: AC
Start: 2022-02-26 — End: 2022-02-26
  Administered 2022-02-26: 1 mL/kg/h via INTRAVENOUS

## 2022-02-26 MED ORDER — HEPARIN (PORCINE) IN NACL 1000-0.9 UT/500ML-% IV SOLN
INTRAVENOUS | Status: AC
  Filled 2022-02-26: qty 1000

## 2022-02-26 MED ORDER — PANTOPRAZOLE SODIUM 40 MG PO TBEC
80.0000 mg | DELAYED_RELEASE_TABLET | Freq: Every day | ORAL | Status: DC
  Administered 2022-02-26 – 2022-02-27 (×2): 80 mg via ORAL
  Filled 2022-02-26 (×2): qty 2

## 2022-02-26 MED ORDER — ACETAMINOPHEN 325 MG PO TABS
650.0000 mg | ORAL_TABLET | ORAL | Status: DC | PRN
Start: 2022-02-26 — End: 2022-02-27
  Administered 2022-02-26: 650 mg via ORAL
  Filled 2022-02-26: qty 2

## 2022-02-26 MED ORDER — VERAPAMIL HCL 2.5 MG/ML IV SOLN
INTRAVENOUS | Status: AC
  Filled 2022-02-26: qty 2

## 2022-02-26 MED ORDER — CLOPIDOGREL BISULFATE 75 MG PO TABS
75.0000 mg | ORAL_TABLET | Freq: Every day | ORAL | Status: DC
Start: 2022-02-26 — End: 2022-02-26

## 2022-02-26 MED ORDER — ROSUVASTATIN CALCIUM 20 MG PO TABS
40.0000 mg | ORAL_TABLET | Freq: Every day | ORAL | Status: DC
  Administered 2022-02-26 – 2022-02-27 (×2): 40 mg via ORAL
  Filled 2022-02-26 (×2): qty 2

## 2022-02-26 MED ORDER — HEPARIN SODIUM (PORCINE) 1000 UNIT/ML IJ SOLN
INTRAMUSCULAR | Status: AC
  Filled 2022-02-26: qty 10

## 2022-02-26 MED ORDER — ASPIRIN 81 MG PO TBEC
81.0000 mg | DELAYED_RELEASE_TABLET | Freq: Every day | ORAL | Status: DC
  Administered 2022-02-27: 81 mg via ORAL
  Filled 2022-02-26: qty 1

## 2022-02-26 MED ORDER — ONDANSETRON HCL 4 MG/2ML IJ SOLN: 4.0000 mg | Freq: Four times a day (QID) | INTRAMUSCULAR | Status: DC | PRN

## 2022-02-26 MED ORDER — SODIUM CHLORIDE 0.9% FLUSH
3.0000 mL | Freq: Two times a day (BID) | INTRAVENOUS | Status: DC
  Administered 2022-02-26 – 2022-02-27 (×2): 3 mL via INTRAVENOUS

## 2022-02-26 MED ORDER — ASPIRIN 81 MG PO CHEW
81.0000 mg | CHEWABLE_TABLET | ORAL | Status: AC
  Administered 2022-02-26: 81 mg via ORAL

## 2022-02-26 MED ORDER — LISINOPRIL 20 MG PO TABS
20.0000 mg | ORAL_TABLET | Freq: Every day | ORAL | Status: DC
  Filled 2022-02-26: qty 1

## 2022-02-26 MED ORDER — VERAPAMIL HCL 2.5 MG/ML IV SOLN
INTRAVENOUS | Status: DC | PRN
  Administered 2022-02-26: 10 mL via INTRA_ARTERIAL

## 2022-02-26 MED ORDER — EZETIMIBE 10 MG PO TABS: 10.0000 mg | ORAL_TABLET | Freq: Every day | ORAL | Status: DC

## 2022-02-26 MED ORDER — MIDAZOLAM HCL 2 MG/2ML IJ SOLN
INTRAMUSCULAR | Status: DC | PRN
  Administered 2022-02-26: 2 mg via INTRAVENOUS

## 2022-02-26 MED ORDER — CLOPIDOGREL BISULFATE 75 MG PO TABS
75.0000 mg | ORAL_TABLET | Freq: Every day | ORAL | Status: DC
  Administered 2022-02-26: 75 mg via ORAL
  Filled 2022-02-26: qty 1

## 2022-02-26 SURGICAL SUPPLY — 11 items
BAND CMPR LRG ZPHR (HEMOSTASIS) ×1
BAND ZEPHYR COMPRESS 30 LONG (HEMOSTASIS) ×1 IMPLANT
CATH 5FR JL3.5 JR4 ANG PIG MP (CATHETERS) ×1 IMPLANT
GLIDESHEATH SLEND SS 6F .021 (SHEATH) ×1 IMPLANT
GUIDEWIRE INQWIRE 1.5J.035X260 (WIRE) IMPLANT
INQWIRE 1.5J .035X260CM (WIRE) ×2
KIT HEART LEFT (KITS) ×2 IMPLANT
PACK CARDIAC CATHETERIZATION (CUSTOM PROCEDURE TRAY) ×2 IMPLANT
SYR MEDRAD MARK 7 150ML (SYRINGE) ×2 IMPLANT
TRANSDUCER W/STOPCOCK (MISCELLANEOUS) ×2 IMPLANT
TUBING CIL FLEX 10 FLL-RA (TUBING) ×2 IMPLANT

## 2022-02-26 NOTE — Interval H&P Note (Signed)
History and Physical Interval Note:  02/26/2022 3:58 PM  John Jimenez  has presented today for surgery, with the diagnosis of nstemi.  The various methods of treatment have been discussed with the patient and family. After consideration of risks, benefits and other options for treatment, the patient has consented to  Procedure(s): LEFT HEART CATH AND CORONARY ANGIOGRAPHY (N/A) as a surgical intervention.  The patient's history has been reviewed, patient examined, no change in status, stable for surgery.  I have reviewed the patient's chart and labs.  Questions were answered to the patient's satisfaction.   Cath Lab Visit (complete for each Cath Lab visit)  Clinical Evaluation Leading to the Procedure:   ACS: Yes.    Non-ACS:    Anginal Classification: CCS III  Anti-ischemic medical therapy: Minimal Therapy (1 class of medications)  Non-Invasive Test Results: Intermediate-risk stress test findings: cardiac mortality 1-3%/year  Prior CABG: No previous CABG        Theron Arista [facility] 02/26/2022 3:58 PM

## 2022-02-26 NOTE — H&P (Signed)
Cardiology Admission History and Physical:   Patient ID: John Jimenez MRN: [id number]; DOB: [birth year]   Admission date: 02/26/2022  PCP:  Wilmer Floor., MD   [facility] Providers Cardiologist:  Norman Herrlich, MD   {  Chief Complaint: Chest pain  Patient Profile:   John Jimenez is a 61 y.o. male with PMH of mild CAD, hypertension, dyslipidemia and COPD who is being seen 02/26/2022 for the evaluation of chest pain.   History of Present Illness:   John Jimenez is a 61 year old male with past medical history noted above.  He has been followed by Dr. Dulce Sellar as an outpatient.  He had a heart catheterization 01/2016 with hypokinetic LV.  Cardiac cath showed 40% stenosis in the distal LAD and 20% stenosis in the mLAD.  EF was noted at 60%.  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  He had a cardiac CTa that showed a high calcium score of 308 which was 88th percentile.  Had a moderate stenosis mid LAD lesion of 50 to 69% with high risk CT appearance and moderately dilated ascending aorta.  FFR was normal.  He was treated with leg ACE inhibitor and beta-blocker.  Known familial hyperlipidemia and takes statin and Zetia.  At his last office visit on 04/2020 it was recommended that he start on PCSK9 therapy.  He presented to [facility] with complaints of ongoing chest tightness on the left side of his chest with associated shortness of breath while working.  His shortness of breath and chest discomfort had been progressively worse over the past week.  In the ED his EKG showed NSR 60bpm, no ischemia.   Labs at [place] showed sodium 140, potassium 4.5, creatinine 0.6, troponin I 0.06>>0.28, WBC 910, hemoglobin 15.5.  Chest x-ray showed atelectasis versus scarring, no acute infiltrate.  Echocardiogram showed LVEF of 60 to 65%, trace MR, no pericardial effusion. Patient was seen by Dr. Herold Harms with recommendations to  transfer to [facility] for cardiac cath.    Past Medical History:  Diagnosis Date   COPD (chronic obstructive pulmonary disease) (HCC) 01/29/2016   DVT (deep venous thrombosis) (HCC)    GERD (gastroesophageal reflux disease) 01/29/2016   Myocardial infarction [facility])    Tobacco abuse 01/29/2016   Ulcer of left ankle [facility])     Past Surgical History:  Procedure Laterality Date   ENDOVENOUS ABLATION SAPHENOUS VEIN W/ LASER Left 04/24/2020   endovenous laser ablation left greater saphenous vein and stab phlebectomy >20 incisions left leg by Fabienne Bruns MD    ROTATOR CUFF REPAIR       Medications Prior to Admission: Prior to Admission medications   Medication Sig Start Date End Date Taking? Authorizing Provider  ALPRAZolam (XANAX) 0.25 MG tablet Take 0.5 mg by mouth 2 (two) times daily as needed. 01/05/20   [provider]  ASPIRIN LOW DOSE 81 MG EC tablet Take 81 mg by mouth daily.  01/22/20   [provider]  clopidogrel (PLAVIX) 75 MG tablet Take by mouth.  11/30/16   [provider]  ezetimibe (ZETIA) 10 MG tablet TAKE 1 TABLET(10 MG) BY MOUTH DAILY 05/06/21   Baldo Daub, MD  lisinopril (ZESTRIL) 20 MG tablet Take 20 mg by mouth daily. 11/15/19   [provider]  metoprolol tartrate (LOPRESSOR) 100 MG tablet TAKE 1 TABLET BY MOUTH ONCE FOR 1 DOSE, TAKE 2 HOURS PRIOR TO YOUR CT 03/13/20   Norman Herrlich  J, MD  omeprazole (PRILOSEC) 40 MG capsule Take by mouth. 11/29/16   [provider]  rosuvastatin (CRESTOR) 40 MG tablet TAKE 1 TABLET(40 MG) BY MOUTH DAILY 05/06/21   Baldo Daub, MD     Allergies:   No Known Allergies  Social History:   Social History   Socioeconomic History   Marital status: Married    Spouse name: Not on file   Number of children: Not on file   Years of education: Not on file   Highest education level: Not on file  Occupational History   Not on file  Tobacco Use   Smoking status: Every Day    Packs/day: 1.00    Types:  Cigarettes   Smokeless tobacco: Never  Vaping Use   Vaping Use: Never used  Substance and Sexual Activity   Alcohol use: Yes    Comment: occassional   Drug use: Not on file   Sexual activity: Not on file  Other Topics Concern   Not on file  Social History Narrative   Not on file   Social Determinants of Health   Financial Resource Strain: Not on file  Food Insecurity: Not on file  Transportation Needs: Not on file  Physical Activity: Not on file  Stress: Not on file  Social Connections: Not on file  Intimate Partner Violence: Not on file    Family History:   The patient's family history includes Breast cancer in his mother; Diabetes in his father; Heart attack in his father and paternal grandfather; Heart disease in his father; Hyperlipidemia in his father and mother; Hypertension in his father, maternal grandmother, and mother.    ROS:  Please see the history of present illness.  All other ROS reviewed and negative.     Physical Exam/Data:   Vitals:   02/26/22 1330 02/26/22 1335 02/26/22 1340  BP: (!) 142/97 (!) 130/105 (!) 139/111  Pulse: (!) 58 (!) 58 66  Resp: 12 18 16   SpO2: 98% 97% 98%  Weight: 99.8 kg    Height: 6\' 1"  (1.854 m)     No intake or output data in the 24 hours ending 02/26/22 1457    02/26/2022    1:30 PM 06/06/2020    2:38 PM 05/02/2020    1:53 PM  Last 3 Weights  Weight (lbs) 220 lb 218 lb 222 lb  Weight (kg) 99.791 kg 98.884 kg 100.699 kg     Body mass index is 29.03 kg/m.  General:  Well nourished, well developed, in no acute distress HEENT: normal Neck: no JVD Vascular: No carotid bruits; Distal pulses 2+ bilaterally   Cardiac:  normal S1, S2; RRR; no murmur  Lungs:  clear to auscultation bilaterally, no wheezing, rhonchi or rales  Abd: soft, nontender, no hepatomegaly  Ext: no edema Musculoskeletal:  No deformities, BUE and BLE strength normal and equal Skin: warm and dry  Neuro:  CNs 2-12 intact, no focal abnormalities noted Psych:   Normal affect    EKG:  The ECG that was done 5/31 was personally reviewed and demonstrates Sinus Bradycardia 60 bpm  Relevant CV Studies:  Echo (in paperchart)  Laboratory Data:  High Sensitivity Troponin:  No results for input(s): TROPONINIHS in the last 720 hours.    ChemistryNo results for input(s): NA, K, CL, CO2, GLUCOSE, BUN, CREATININE, CALCIUM, MG, GFRNONAA, GFRAA, ANIONGAP in the last 168 hours.  No results for input(s): PROT, ALBUMIN, AST, ALT, ALKPHOS, BILITOT in the last 168 hours. Lipids No results for  input(s): CHOL, TRIG, HDL, LABVLDL, LDLCALC, CHOLHDL in the last 168 hours. HematologyNo results for input(s): WBC, RBC, HGB, HCT, MCV, MCH, MCHC, RDW, PLT in the last 168 hours. Thyroid No results for input(s): TSH, FREET4 in the last 168 hours. BNPNo results for input(s): BNP, PROBNP in the last 168 hours.  DDimer No results for input(s): DDIMER in the last 168 hours.   Radiology/Studies:  No results found.   Assessment and Plan:   John Jimenez is a 61 y.o. male with PMH of mild CAD, hypertension, dyslipidemia and COPD who is being seen 02/26/2022 for the evaluation of chest pain.   NSTEMI: Troponin I peaked 0.28. EKG without ischemic changes. Does have hx of mild non-obstructive disease on prior cath in 2017. Transferred to [facility] for cardiac cath.  -- continue ASA, crestor, BB, lisinopril   Shared Decision Making/Informed Consent The risks [stroke (1 in 1000), death (1 in 1000), kidney failure [usually temporary] (1 in 500), bleeding (1 in 200), allergic reaction [possibly serious] (1 in 200)], benefits (diagnostic support and management of coronary artery disease) and alternatives of a cardiac catheterization were discussed in detail with John Jimenez and he is willing to proceed.  HTN: treated with atenolol and lisinopril while at [facility] -- further recommendations post cath  HLD: on Crestor 40mg  daily, Zetia 10mg  daily -- LDL 29, HDL 32   Risk Assessment/Risk  Scores:   TIMI Risk Score for Unstable Angina or Non-ST Elevation MI:   The patient's TIMI risk score is 4, which indicates a 20% risk of all cause mortality, new or recurrent myocardial infarction or need for urgent revascularization in the next 14 days.{  Severity of Illness: The appropriate patient status for this patient is INPATIENT. Inpatient status is judged to be reasonable and necessary in order to provide the required intensity of service to ensure the patient's safety. The patient's presenting symptoms, physical exam findings, and initial radiographic and laboratory data in the context of their chronic comorbidities is felt to place them at high risk for further clinical deterioration. Furthermore, it is not anticipated that the patient will be medically stable for discharge from the hospital within 2 midnights of admission.   * I certify that at the point of admission it is my clinical judgment that the patient will require inpatient hospital care spanning beyond 2 midnights from the point of admission due to high intensity of service, high risk for further deterioration and high frequency of surveillance required.*   For questions or updates, please contact [facility] Please consult [email or web address] for contact info under     Signed, , NP  02/26/2022 2:57 PM

## 2022-02-26 NOTE — Progress Notes (Signed)
Pt BIB by [facility] from [facility]. Pt brought in for a left heart cath. [facility] reports pt presented to the hospital with complaints of left shoulder CP x2 days. Pt denies any pain at the moment. VSS with [facility].  Pt is A&Ox4 and placed on telemetry monitor.

## 2022-02-27 ENCOUNTER — Other Ambulatory Visit: Payer: Self-pay

## 2022-02-27 ENCOUNTER — Encounter (HOSPITAL_COMMUNITY): Payer: Self-pay | Admitting: Cardiology

## 2022-02-27 DIAGNOSIS — E785 Hyperlipidemia, unspecified: Secondary | ICD-10-CM

## 2022-02-27 DIAGNOSIS — R079 Chest pain, unspecified: Secondary | ICD-10-CM

## 2022-02-27 MED ORDER — PNEUMOCOCCAL 20-VAL CONJ VACC 0.5 ML IM SUSY
0.5000 mL | PREFILLED_SYRINGE | Freq: Once | INTRAMUSCULAR | Status: DC
  Filled 2022-02-27: qty 0.5

## 2022-02-27 MED ORDER — PNEUMOCOCCAL 20-VAL CONJ VACC 0.5 ML IM SUSY: 0.5000 mL | PREFILLED_SYRINGE | INTRAMUSCULAR | Status: DC

## 2022-02-27 MED ORDER — NICOTINE 21 MG/24HR TD PT24: 21.0000 mg | MEDICATED_PATCH | TRANSDERMAL | 1 refills | Status: AC

## 2022-02-27 MED ORDER — LISINOPRIL 10 MG PO TABS
10.0000 mg | ORAL_TABLET | Freq: Every day | ORAL | Status: DC
  Administered 2022-02-27: 10 mg via ORAL
  Filled 2022-02-27: qty 1

## 2022-02-27 MED FILL — Lidocaine HCl Local Preservative Free (PF) Inj 1%: INTRAMUSCULAR | Qty: 30 | Status: AC

## 2022-02-27 NOTE — Progress Notes (Signed)
Patient given discharge instructions and stated understanding. 

## 2022-02-27 NOTE — Discharge Summary (Addendum)
Discharge Summary    Patient ID: John Jimenez MRN: [id number]; DOB: Feb 12, 1961  Admit date: 02/26/2022 Discharge date: 02/27/2022  PCP:  Wilmer Floor., John Jimenez   [facility] Providers Cardiologist:  Norman Herrlich, John Jimenez     Discharge Diagnoses    Principal Problem:   Chest pain of uncertain etiology Active Problems:   Tobacco abuse   Hyperlipidemia  Diagnostic Studies/Procedures    Cath: 02/26/22   Prox LAD to Mid LAD lesion is 35% stenosed.   Prox RCA lesion is 30% stenosed.   LV end diastolic pressure is mildly elevated.   Nonobstructive CAD Mildly elevated LVEDP   Plan: continue medical management and risk factor modification  Echo: (paper chart from [facility])  LVEF of 60 to 65%, trace MR, no pericardial effusion _____________   History of Present Illness     John Jimenez is a 61 y.o. male with PMH of mild CAD, hypertension, dyslipidemia and COPD who seen 02/26/2022 for the evaluation of chest pain. John Jimenez is a 61 year old male with past medical history noted above.  He has been followed by Dr. Dulce Sellar as an outpatient.  He had a heart catheterization 01/2016 with hypokinetic LV.  Cardiac cath showed 40% stenosis in the distal LAD and 20% stenosis in the mLAD.  EF was noted at 60%.  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  He had a cardiac CTa that showed a high calcium score of 308 which was 88th percentile.  Had a moderate stenosis mid LAD lesion of 50 to 69% with high risk CT appearance and moderately dilated ascending aorta.  FFR was normal.  He was treated with leg ACE inhibitor and beta-blocker.  Known familial hyperlipidemia and takes statin and Zetia.  At his last office visit on 04/2020 it was recommended that he start on PCSK9 therapy.   He presented to [facility] with complaints of ongoing chest tightness on the left side of his chest with associated shortness of breath while working.   His shortness of breath and chest discomfort had been progressively worse over the past week.  In the ED his EKG showed NSR 60bpm, no ischemia.    Labs at [place] showed sodium 140, potassium 4.5, creatinine 0.6, troponin I 0.06>>0.28, WBC 910, hemoglobin 15.5.  Chest x-ray showed atelectasis versus scarring, no acute infiltrate.  Echocardiogram showed LVEF of 60 to 65%, trace MR, no pericardial effusion. Patient was seen by Dr. Herold Harms with recommendations to transfer to [facility] for cardiac cath.   Hospital Course     Chest pain/elevated troponin: Troponin I peaked 0.28. EKG without ischemic changes. Did have hx of mild non-obstructive disease on prior cath in 2017. Transferred to [facility] for cardiac cath. Cath noted above with mild, nonobstructive CAD. Recommendations for medical management. Of note, had been on ASA/plavix prior to admission, given no PCI done instructed patient he could stop plavix at this time but continue ASA. -- continue ASA, crestor, lisinopril    HTN: treated with atenolol and lisinopril while at [place]. Blood pressures stable -- will continue lisinopril 10mg  daily at discharge (home medication)   HLD: on Crestor 40mg  daily, Zetia 10mg  daily -- LDL 29, HDL 32  Tobacco use: cessation advised -- nicotine patches ordered at DC  Did the patient have an acute coronary syndrome (MI, NSTEMI, STEMI, etc) this admission?:  No.   The elevated Troponin was due to the acute medical illness (demand ischemia).  ____________  Discharge Vitals Blood pressure (!) 128/91, pulse 67, temperature (!) 97.5 F (36.4 C), temperature source Oral, resp. rate 17, height 6\' 1"  (1.854 m), weight 99.8 kg, SpO2 96 %.  Filed Weights   02/26/22 1330  Weight: 99.8 kg    Labs & Radiologic Studies    CBC No results for input(s): WBC, NEUTROABS, HGB, HCT, MCV, PLT in the last 72 hours. Basic Metabolic Panel No results for input(s): NA, K, CL, CO2, GLUCOSE, BUN, CREATININE, CALCIUM, MG, PHOS in  the last 72 hours. Liver Function Tests No results for input(s): AST, ALT, ALKPHOS, BILITOT, PROT, ALBUMIN in the last 72 hours. No results for input(s): LIPASE, AMYLASE in the last 72 hours. High Sensitivity Troponin:   No results for input(s): TROPONINIHS in the last 720 hours.  BNP Invalid input(s): POCBNP D-Dimer No results for input(s): DDIMER in the last 72 hours. Hemoglobin A1C No results for input(s): HGBA1C in the last 72 hours. Fasting Lipid Panel No results for input(s): CHOL, HDL, LDLCALC, TRIG, CHOLHDL, LDLDIRECT in the last 72 hours. Thyroid Function Tests No results for input(s): TSH, T4TOTAL, T3FREE, THYROIDAB in the last 72 hours.  Invalid input(s): FREET3 _____________  CARDIAC CATHETERIZATION  Result Date: 02/26/2022   Prox LAD to Mid LAD lesion is 35% stenosed.   Prox RCA lesion is 30% stenosed.   LV end diastolic pressure is mildly elevated. Nonobstructive CAD Mildly elevated LVEDP Plan: continue medical management and risk factor modification    Disposition   Pt is being discharged home today in good condition.  Follow-up Plans & Appointments     Follow-up Information     John Daub, John Jimenez Follow up.   Specialties: Cardiology, Radiology Why: Office will call you with a follow up appt in the next 2-3 business days. Contact information: [address] [place] [address] [phone]                Discharge Instructions     Diet - low sodium heart healthy   Complete by: As directed    Discharge instructions   Complete by: As directed    Radial Site Care Refer to this sheet in the next few weeks. These instructions provide you with information on caring for yourself after your procedure. Your caregiver may also give you more specific instructions. Your treatment has been planned according to current medical practices, but problems sometimes occur. Call your caregiver if you have any problems or questions after your procedure. HOME CARE  INSTRUCTIONS You may shower the day after the procedure. Remove the bandage (dressing) and gently wash the site with plain soap and water. Gently pat the site dry.  Do not apply powder or lotion to the site.  Do not submerge the affected site in water for 3 to 5 days.  Inspect the site at least twice daily.  Do not flex or bend the affected arm for 24 hours.  No lifting over 5 pounds (2.3 kg) for 5 days after your procedure.  Do not drive home if you are discharged the same day of the procedure. Have someone else drive you.  You may drive 24 hours after the procedure unless otherwise instructed by your caregiver.  What to expect: Any bruising will usually fade within 1 to 2 weeks.  Blood that collects in the tissue (hematoma) may be painful to the touch. It should usually decrease in size and tenderness within 1 to 2 weeks.  SEEK IMMEDIATE MEDICAL CARE IF: You have unusual  pain at the radial site.  You have redness, warmth, swelling, or pain at the radial site.  You have drainage (other than a small amount of blood on the dressing).  You have chills.  You have a fever or persistent symptoms for more than 72 hours.  You have a fever and your symptoms suddenly get worse.  Your arm becomes pale, cool, tingly, or numb.  You have heavy bleeding from the site. Hold pressure on the site.   Increase activity slowly   Complete by: As directed    No wound care   Complete by: As directed        Discharge Medications   Allergies as of 02/27/2022   No Known Allergies      Medication List     STOP taking these medications    atenolol 25 MG tablet Commonly known as: TENORMIN   clopidogrel 75 MG tablet Commonly known as: PLAVIX   enoxaparin 100 MG/ML injection Commonly known as: LOVENOX       TAKE these medications    ALPRAZolam 0.25 MG tablet Commonly known as: XANAX Take 0.5 mg by mouth 2 (two) times daily as needed for anxiety.   Aspirin Low Dose 81 MG tablet Generic  drug: aspirin EC Take 81 mg by mouth daily.   CENTRUM SILVER 50+MEN PO Take 1 tablet by mouth daily.   ezetimibe 10 MG tablet Commonly known as: ZETIA TAKE 1 TABLET(10 MG) BY MOUTH DAILY What changed: See the new instructions.   lisinopril 10 MG tablet Commonly known as: ZESTRIL Take 10 mg by mouth daily. What changed: Another medication with the same name was removed. Continue taking this medication, and follow the directions you see here.   nicotine 21 mg/24hr patch Commonly known as: NICODERM CQ - dosed in mg/24 hours Place 1 patch (21 mg total) onto the skin daily.   omeprazole 40 MG capsule Commonly known as: PRILOSEC Take 40 mg by mouth daily.   rosuvastatin 40 MG tablet Commonly known as: CRESTOR TAKE 1 TABLET(40 MG) BY MOUTH DAILY What changed: See the new instructions.   tadalafil 20 MG tablet Commonly known as: CIALIS Take 20 mg by mouth as needed for erectile dysfunction.        Outstanding Labs/Studies   N/a   Duration of Discharge Encounter   Greater than 30 minutes including physician time.  Signed, Laverda Page, NP 02/27/2022, 12:00 PM  ATTENDING ATTESTATION:  After conducting a review of all available clinical information with the care team, interviewing the patient, and performing a physical exam, I agree with the findings and plan described in this note.   GEN: No acute distress.   HEENT:  MMM, no JVD, no scleral icterus Cardiac: RRR, no murmurs, rubs, or gallops.  Respiratory: Clear to auscultation bilaterally. GI: Soft, nontender, non-distended  MS: No edema; No deformity. Neuro:  Nonfocal  Vasc:  +2 radial pulses  Patient is doing well after reassuring coronary angiography study.  The patient remains chest pain-free.  Discharge today with statin, Zetia, and aspirin with cardiology follow-up.  Alverda Skeans, John Jimenez Pager [phone]

## 2022-02-27 NOTE — Plan of Care (Signed)

## 2022-02-27 NOTE — Plan of Care (Signed)

## 2022-03-02 ENCOUNTER — Telehealth: Payer: Self-pay | Admitting: Cardiology

## 2022-03-02 NOTE — Telephone Encounter (Signed)
-----   Message from Arty Baumgartner, NP sent at 02/27/2022  9:25 AM EDT ----- Regarding: Follow up appt Please schedule this patient for a follow-up appointment and call them with that information.  Primary Cardiologist: Dulce Sellar Date of Discharge: 02/27/2022 Appointment Needed Within: 2-3 weeks Appointment Type: hospital follow up appt  Thank you! Laverda Page, NP-C

## 2022-03-02 NOTE — Telephone Encounter (Signed)
LVM on cell and home phone/kbl 02/27/22  LVM on home phone/kbl 03/02/2022

## 2022-03-02 NOTE — Telephone Encounter (Signed)
Pt returning nurses call. Call transferred 

## 2022-03-03 DIAGNOSIS — I739 Peripheral vascular disease, unspecified: Secondary | ICD-10-CM | POA: Diagnosis not present

## 2022-03-03 DIAGNOSIS — I10 Essential (primary) hypertension: Secondary | ICD-10-CM | POA: Diagnosis not present

## 2022-03-03 DIAGNOSIS — E785 Hyperlipidemia, unspecified: Secondary | ICD-10-CM | POA: Diagnosis not present

## 2022-03-03 DIAGNOSIS — E538 Deficiency of other specified B group vitamins: Secondary | ICD-10-CM | POA: Diagnosis not present

## 2022-03-03 DIAGNOSIS — I2510 Atherosclerotic heart disease of native coronary artery without angina pectoris: Secondary | ICD-10-CM | POA: Diagnosis not present

## 2022-03-03 DIAGNOSIS — E559 Vitamin D deficiency, unspecified: Secondary | ICD-10-CM | POA: Diagnosis not present

## 2022-03-03 DIAGNOSIS — Z720 Tobacco use: Secondary | ICD-10-CM | POA: Diagnosis not present

## 2022-03-03 DIAGNOSIS — I779 Disorder of arteries and arterioles, unspecified: Secondary | ICD-10-CM | POA: Diagnosis not present

## 2022-03-04 LAB — LIPOPROTEIN A (LPA): Lipoprotein (a): 206.1 nmol/L — ABNORMAL HIGH (ref <75.0)

## 2022-03-16 ENCOUNTER — Ambulatory Visit: Payer: BC Managed Care – PPO | Admitting: Cardiology

## 2022-05-14 DIAGNOSIS — L03116 Cellulitis of left lower limb: Secondary | ICD-10-CM | POA: Diagnosis not present

## 2022-05-14 DIAGNOSIS — Z6829 Body mass index (BMI) 29.0-29.9, adult: Secondary | ICD-10-CM | POA: Diagnosis not present

## 2022-05-19 ENCOUNTER — Other Ambulatory Visit: Payer: Self-pay | Admitting: *Deleted

## 2022-05-19 DIAGNOSIS — I83812 Varicose veins of left lower extremities with pain: Secondary | ICD-10-CM

## 2022-05-19 DIAGNOSIS — L97329 Non-pressure chronic ulcer of left ankle with unspecified severity: Secondary | ICD-10-CM

## 2022-05-20 DIAGNOSIS — L03116 Cellulitis of left lower limb: Secondary | ICD-10-CM | POA: Diagnosis not present

## 2022-05-20 DIAGNOSIS — Z6829 Body mass index (BMI) 29.0-29.9, adult: Secondary | ICD-10-CM | POA: Diagnosis not present
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
## 2022-05-29 ENCOUNTER — Ambulatory Visit (INDEPENDENT_AMBULATORY_CARE_PROVIDER_SITE_OTHER): Payer: BC Managed Care – PPO | Admitting: Vascular Surgery

## 2022-05-29 ENCOUNTER — Encounter: Payer: Self-pay | Admitting: Vascular Surgery

## 2022-05-29 ENCOUNTER — Ambulatory Visit (HOSPITAL_COMMUNITY)
Admission: RE | Admit: 2022-05-29 | Discharge: 2022-05-29 | Disposition: A | Discharge status: Home / Self Care | Payer: BC Managed Care – PPO | Source: Ambulatory Visit | Attending: Vascular Surgery | Admitting: Vascular Surgery

## 2022-05-29 VITALS — BP 120/87 | HR 73 | Temp 98.2°F | Resp 18 | Ht 73.0 in | Wt 228.0 lb

## 2022-05-29 DIAGNOSIS — I872 Venous insufficiency (chronic) (peripheral): Secondary | ICD-10-CM

## 2022-05-29 DIAGNOSIS — I83812 Varicose veins of left lower extremities with pain: Secondary | ICD-10-CM | POA: Diagnosis not present

## 2022-05-29 DIAGNOSIS — L97329 Non-pressure chronic ulcer of left ankle with unspecified severity: Secondary | ICD-10-CM | POA: Diagnosis not present

## 2022-11-11 DIAGNOSIS — N521 Erectile dysfunction due to diseases classified elsewhere: Secondary | ICD-10-CM | POA: Diagnosis not present

## 2022-11-11 DIAGNOSIS — I10 Essential (primary) hypertension: Secondary | ICD-10-CM | POA: Diagnosis not present

## 2022-11-11 DIAGNOSIS — I739 Peripheral vascular disease, unspecified: Secondary | ICD-10-CM | POA: Diagnosis not present

## 2022-11-11 DIAGNOSIS — I779 Disorder of arteries and arterioles, unspecified: Secondary | ICD-10-CM | POA: Diagnosis not present

## 2022-12-03 DIAGNOSIS — Z131 Encounter for screening for diabetes mellitus: Secondary | ICD-10-CM | POA: Diagnosis not present

## 2022-12-03 DIAGNOSIS — Z6830 Body mass index (BMI) 30.0-30.9, adult: Secondary | ICD-10-CM | POA: Diagnosis not present

## 2022-12-03 DIAGNOSIS — E785 Hyperlipidemia, unspecified: Secondary | ICD-10-CM | POA: Diagnosis not present

## 2022-12-03 DIAGNOSIS — Z0000 Encounter for general adult medical examination without abnormal findings: Secondary | ICD-10-CM | POA: Diagnosis not present

## 2022-12-03 DIAGNOSIS — E559 Vitamin D deficiency, unspecified: Secondary | ICD-10-CM | POA: Diagnosis not present

## 2022-12-03 DIAGNOSIS — Z79899 Other long term (current) drug therapy: Secondary | ICD-10-CM | POA: Diagnosis not present

## 2023-02-24 DIAGNOSIS — Z6830 Body mass index (BMI) 30.0-30.9, adult: Secondary | ICD-10-CM | POA: Diagnosis not present

## 2023-02-24 DIAGNOSIS — J0100 Acute maxillary sinusitis, unspecified: Secondary | ICD-10-CM | POA: Diagnosis not present
